# Patient Record
Sex: Male | Born: 1985 | Race: Black or African American | Hispanic: No | Marital: Married | Smoking: Never smoker
Health system: Southern US, Community
[De-identification: ages and names within clinical notes are randomized; demographics above are authoritative.]

## PROBLEM LIST (undated history)

## (undated) DIAGNOSIS — J45909 Unspecified asthma, uncomplicated: Secondary | ICD-10-CM

## (undated) HISTORY — PX: WISDOM TOOTH EXTRACTION: SHX21

---

## 2017-11-25 ENCOUNTER — Other Ambulatory Visit
Admission: RE | Admit: 2017-11-25 | Discharge: 2017-11-25 | Disposition: A | Discharge status: Home / Self Care | Payer: BLUE CROSS/BLUE SHIELD | Source: Ambulatory Visit | Attending: Sports Medicine | Admitting: Sports Medicine

## 2017-11-25 DIAGNOSIS — M25462 Effusion, left knee: Secondary | ICD-10-CM | POA: Insufficient documentation

## 2017-11-25 LAB — SYNOVIAL CELL COUNT + DIFF, W/ CRYSTALS
Crystals, Fluid: NONE SEEN
Eosinophils-Synovial: 0 %
LYMPHOCYTES-SYNOVIAL FLD: 68 %
Monocyte-Macrophage-Synovial Fluid: 15 %
Neutrophil, Synovial: 17 %
OTHER CELLS-SYN: 0
WBC, Synovial: 323 /mm3 — ABNORMAL HIGH (ref 0–200)

## 2017-11-27 ENCOUNTER — Other Ambulatory Visit: Payer: Self-pay | Admitting: Sports Medicine

## 2017-11-27 DIAGNOSIS — M25462 Effusion, left knee: Secondary | ICD-10-CM

## 2017-11-29 LAB — BODY FLUID CULTURE: Culture: NO GROWTH

## 2017-12-03 ENCOUNTER — Ambulatory Visit: Payer: BLUE CROSS/BLUE SHIELD

## 2017-12-23 ENCOUNTER — Ambulatory Visit
Admission: RE | Admit: 2017-12-23 | Discharge: 2017-12-23 | Disposition: A | Discharge status: Home / Self Care | Payer: BLUE CROSS/BLUE SHIELD | Source: Ambulatory Visit | Attending: Sports Medicine | Admitting: Sports Medicine

## 2017-12-23 DIAGNOSIS — M25462 Effusion, left knee: Secondary | ICD-10-CM | POA: Diagnosis present

## 2017-12-30 ENCOUNTER — Other Ambulatory Visit: Payer: Self-pay | Admitting: Orthopedic Surgery

## 2017-12-30 ENCOUNTER — Ambulatory Visit
Admission: RE | Admit: 2017-12-30 | Discharge: 2017-12-30 | Disposition: A | Discharge status: Home / Self Care | Payer: BLUE CROSS/BLUE SHIELD | Source: Ambulatory Visit | Attending: Orthopedic Surgery | Admitting: Orthopedic Surgery

## 2017-12-30 DIAGNOSIS — M2410 Other articular cartilage disorders, unspecified site: Secondary | ICD-10-CM

## 2017-12-30 DIAGNOSIS — M25562 Pain in left knee: Secondary | ICD-10-CM | POA: Diagnosis present

## 2017-12-30 DIAGNOSIS — M2170 Unequal limb length (acquired), unspecified site: Secondary | ICD-10-CM | POA: Insufficient documentation

## 2018-09-16 ENCOUNTER — Other Ambulatory Visit: Payer: Self-pay

## 2018-09-16 ENCOUNTER — Encounter: Payer: Self-pay | Admitting: *Deleted

## 2018-09-23 ENCOUNTER — Ambulatory Visit: Payer: BLUE CROSS/BLUE SHIELD | Admitting: Anesthesiology

## 2018-09-23 ENCOUNTER — Encounter
Admission: RE | Disposition: A | Discharge status: Home / Self Care | Payer: Self-pay | Source: Ambulatory Visit | Attending: Orthopedic Surgery

## 2018-09-23 ENCOUNTER — Ambulatory Visit
Admission: RE | Admit: 2018-09-23 | Discharge: 2018-09-23 | Disposition: A | Discharge status: Home / Self Care | Payer: BLUE CROSS/BLUE SHIELD | Source: Ambulatory Visit | Attending: Orthopedic Surgery | Admitting: Orthopedic Surgery

## 2018-09-23 DIAGNOSIS — Z6830 Body mass index (BMI) 30.0-30.9, adult: Secondary | ICD-10-CM | POA: Diagnosis not present

## 2018-09-23 DIAGNOSIS — M2410 Other articular cartilage disorders, unspecified site: Secondary | ICD-10-CM | POA: Diagnosis not present

## 2018-09-23 DIAGNOSIS — E669 Obesity, unspecified: Secondary | ICD-10-CM | POA: Insufficient documentation

## 2018-09-23 DIAGNOSIS — M65862 Other synovitis and tenosynovitis, left lower leg: Secondary | ICD-10-CM | POA: Insufficient documentation

## 2018-09-23 HISTORY — DX: Unspecified asthma, uncomplicated: J45.909

## 2018-09-23 HISTORY — PX: KNEE ARTHROSCOPY: SHX127

## 2018-09-23 SURGERY — ARTHROSCOPY, KNEE
Anesthesia: General | Site: Knee | Laterality: Left

## 2018-09-23 MED ORDER — ACETAMINOPHEN 500 MG PO TABS: 1000.0000 mg | ORAL_TABLET | Freq: Three times a day (TID) | ORAL | 2 refills | Status: DC

## 2018-09-23 MED ORDER — LACTATED RINGERS IV SOLN
INTRAVENOUS | Status: DC
  Administered 2018-09-23: 13:00:00 via INTRAVENOUS

## 2018-09-23 MED ORDER — FENTANYL CITRATE (PF) 100 MCG/2ML IJ SOLN: 25.0000 ug | INTRAMUSCULAR | Status: DC | PRN

## 2018-09-23 MED ORDER — LIDOCAINE-EPINEPHRINE 1 %-1:100000 IJ SOLN
INTRAMUSCULAR | Status: DC | PRN
  Administered 2018-09-23: 12 mL

## 2018-09-23 MED ORDER — ONDANSETRON 4 MG PO TBDP: 4.0000 mg | ORAL_TABLET | Freq: Three times a day (TID) | ORAL | 0 refills | Status: DC | PRN

## 2018-09-23 MED ORDER — ONDANSETRON HCL 4 MG/2ML IJ SOLN
INTRAMUSCULAR | Status: DC | PRN
  Administered 2018-09-23: 4 mg via INTRAVENOUS

## 2018-09-23 MED ORDER — OXYCODONE HCL 5 MG PO TABS
5.0000 mg | ORAL_TABLET | Freq: Once | ORAL | Status: AC | PRN
  Administered 2018-09-23: 5 mg via ORAL

## 2018-09-23 MED ORDER — HYDROCODONE-ACETAMINOPHEN 5-325 MG PO TABS: 1.0000 | ORAL_TABLET | ORAL | 0 refills | Status: DC | PRN

## 2018-09-23 MED ORDER — DEXAMETHASONE SODIUM PHOSPHATE 4 MG/ML IJ SOLN
INTRAMUSCULAR | Status: DC | PRN
  Administered 2018-09-23: 4 mg via INTRAVENOUS

## 2018-09-23 MED ORDER — OXYCODONE HCL 5 MG/5ML PO SOLN: 5.0000 mg | Freq: Once | ORAL | Status: AC | PRN

## 2018-09-23 MED ORDER — PROPOFOL 10 MG/ML IV BOLUS
INTRAVENOUS | Status: DC | PRN
  Administered 2018-09-23: 200 mg via INTRAVENOUS

## 2018-09-23 MED ORDER — MIDAZOLAM HCL 5 MG/5ML IJ SOLN
INTRAMUSCULAR | Status: DC | PRN
  Administered 2018-09-23: 2 mg via INTRAVENOUS

## 2018-09-23 MED ORDER — GLYCOPYRROLATE 0.2 MG/ML IJ SOLN
INTRAMUSCULAR | Status: DC | PRN
  Administered 2018-09-23: 0.1 mg via INTRAVENOUS

## 2018-09-23 MED ORDER — ASPIRIN EC 325 MG PO TBEC: 325.0000 mg | DELAYED_RELEASE_TABLET | Freq: Every day | ORAL | 0 refills | Status: AC

## 2018-09-23 MED ORDER — LIDOCAINE HCL (CARDIAC) PF 100 MG/5ML IV SOSY
PREFILLED_SYRINGE | INTRAVENOUS | Status: DC | PRN
  Administered 2018-09-23: 30 mg via INTRATRACHEAL

## 2018-09-23 MED ORDER — FENTANYL CITRATE (PF) 100 MCG/2ML IJ SOLN
INTRAMUSCULAR | Status: DC | PRN
  Administered 2018-09-23 (×2): 25 ug via INTRAVENOUS

## 2018-09-23 MED ORDER — CEFAZOLIN SODIUM-DEXTROSE 2-4 GM/100ML-% IV SOLN
2.0000 g | Freq: Once | INTRAVENOUS | Status: AC
  Administered 2018-09-23: 2 g via INTRAVENOUS

## 2018-09-23 MED ORDER — IBUPROFEN 800 MG PO TABS: 800.0000 mg | ORAL_TABLET | Freq: Three times a day (TID) | ORAL | 0 refills | Status: AC

## 2018-09-23 MED ORDER — PROMETHAZINE HCL 25 MG/ML IJ SOLN: 6.2500 mg | INTRAMUSCULAR | Status: DC | PRN

## 2018-09-23 SURGICAL SUPPLY — 39 items
ADAPTER IRRIG TUBE 2 SPIKE SOL (ADAPTER) ×6 IMPLANT
BLADE SURG SZ11 CARB STEEL (BLADE) ×3 IMPLANT
BNDG COHESIVE 4X5 TAN STRL (GAUZE/BANDAGES/DRESSINGS) ×3 IMPLANT
BNDG ESMARK 6X12 TAN STRL LF (GAUZE/BANDAGES/DRESSINGS) ×3 IMPLANT
BUR RADIUS 4.0X18.5 (BURR) ×3 IMPLANT
CHLORAPREP W/TINT 26ML (MISCELLANEOUS) ×3 IMPLANT
COOLER POLAR GLACIER W/PUMP (MISCELLANEOUS) ×3 IMPLANT
COVER LIGHT HANDLE UNIVERSAL (MISCELLANEOUS) ×6 IMPLANT
CUFF TOURN SGL QUICK 30 (MISCELLANEOUS) ×2
CUFF TRNQT CYL LO 30X4X (MISCELLANEOUS) ×1 IMPLANT
DECANTER SPIKE VIAL GLASS SM (MISCELLANEOUS) ×3 IMPLANT
DRAPE IMP U-DRAPE 54X76 (DRAPES) ×3 IMPLANT
GAUZE SPONGE 4X4 12PLY STRL (GAUZE/BANDAGES/DRESSINGS) ×3 IMPLANT
GLOVE BIO SURGEON STRL SZ7.5 (GLOVE) ×3 IMPLANT
GLOVE BIOGEL PI IND STRL 8 (GLOVE) ×1 IMPLANT
GLOVE BIOGEL PI INDICATOR 8 (GLOVE) ×2
GOWN STRL REIN 2XL XLG LVL4 (GOWN DISPOSABLE) ×6 IMPLANT
GOWN STRL REUS W/TWL LRG LVL3 (GOWN DISPOSABLE) ×3 IMPLANT
IV LACTATED RINGER IRRG 3000ML (IV SOLUTION) ×8
IV LR IRRIG 3000ML ARTHROMATIC (IV SOLUTION) ×4 IMPLANT
KIT TURNOVER KIT A (KITS) ×3 IMPLANT
MAT ABSORB  FLUID 56X50 GRAY (MISCELLANEOUS) ×4
MAT ABSORB FLUID 56X50 GRAY (MISCELLANEOUS) ×2 IMPLANT
NEEDLE HYPO 21X1.5 SAFETY (NEEDLE) ×3 IMPLANT
NEPTUNE MANIFOLD (MISCELLANEOUS) ×3 IMPLANT
PACK ARTHROSCOPY KNEE (MISCELLANEOUS) ×3 IMPLANT
PAD ABD DERMACEA PRESS 5X9 (GAUZE/BANDAGES/DRESSINGS) ×6 IMPLANT
PAD WRAPON POLAR KNEE (MISCELLANEOUS) ×1 IMPLANT
PADDING CAST BLEND 6X4 STRL (MISCELLANEOUS) ×1 IMPLANT
PADDING STRL CAST 6IN (MISCELLANEOUS) ×2
SET TUBE SUCT SHAVER OUTFL 24K (TUBING) ×3 IMPLANT
SET TUBE TIP INTRA-ARTICULAR (MISCELLANEOUS) ×3 IMPLANT
SUT ETHILON 3-0 FS-10 30 BLK (SUTURE) ×6
SUTURE EHLN 3-0 FS-10 30 BLK (SUTURE) ×2 IMPLANT
TOWEL OR 17X26 4PK STRL BLUE (TOWEL DISPOSABLE) ×6 IMPLANT
TUBING ARTHRO INFLOW-ONLY STRL (TUBING) ×3 IMPLANT
WAND HAND CNTRL MULTIVAC 50 (MISCELLANEOUS) IMPLANT
WAND HAND CNTRL MULTIVAC 90 (MISCELLANEOUS) ×3 IMPLANT
WRAPON POLAR PAD KNEE (MISCELLANEOUS) ×3

## 2018-09-23 NOTE — Anesthesia Postprocedure Evaluation (Signed)
Anesthesia Post Note  Patient: Benjamin Mcneil  Procedure(s) Performed: ARTHROSCOPY KNEE CHONDROPLASTY AND CARTILAGE BIOPSY FOR MACI SURGERY (Left Knee)  Patient location during evaluation: PACU Anesthesia Type: General Level of consciousness: awake and alert Pain management: pain level controlled Vital Signs Assessment: post-procedure vital signs reviewed and stable Respiratory status: spontaneous breathing, nonlabored ventilation, respiratory function stable and patient connected to nasal cannula oxygen Cardiovascular status: blood pressure returned to baseline and stable Postop Assessment: no apparent nausea or vomiting Anesthetic complications: no    Rheba Diamond C

## 2018-09-23 NOTE — Anesthesia Preprocedure Evaluation (Signed)
Anesthesia Evaluation  Patient identified by MRN, date of birth, ID band Patient awake    Reviewed: Allergy & Precautions, NPO status , Patient's Chart, lab work & pertinent test results  Airway Mallampati: II  TM Distance: >3 FB Neck ROM: Full    Dental no notable dental hx.    Pulmonary asthma ,  No current asthma symptoms.    Pulmonary exam normal breath sounds clear to auscultation       Cardiovascular negative cardio ROS Normal cardiovascular exam Rhythm:Regular Rate:Normal     Neuro/Psych negative neurological ROS  negative psych ROS   GI/Hepatic negative GI ROS, Neg liver ROS,   Endo/Other  negative endocrine ROS  Renal/GU negative Renal ROS  negative genitourinary   Musculoskeletal negative musculoskeletal ROS (+)   Abdominal   Peds negative pediatric ROS (+)  Hematology negative hematology ROS (+)   Anesthesia Other Findings   Reproductive/Obstetrics negative OB ROS                             Anesthesia Physical Anesthesia Plan  ASA: I  Anesthesia Plan: General   Post-op Pain Management:    Induction: Intravenous  PONV Risk Score and Plan:   Airway Management Planned: LMA  Additional Equipment:   Intra-op Plan:   Post-operative Plan: Extubation in OR  Informed Consent: I have reviewed the patients History and Physical, chart, labs and discussed the procedure including the risks, benefits and alternatives for the proposed anesthesia with the patient or authorized representative who has indicated his/her understanding and acceptance.   Dental advisory given  Plan Discussed with: CRNA  Anesthesia Plan Comments:         Anesthesia Quick Evaluation

## 2018-09-23 NOTE — OR Nursing (Signed)
3 cartilage specimens placed in provided medium and shipped in provided shipping container to [employer].

## 2018-09-23 NOTE — Op Note (Signed)
DATE: 09/23/2018   PRE-OP DIAGNOSIS:  1. Left medial femoral condyle chondral defect   POST-OP DIAGNOSIS:  1. Left medial femoral condyle chondral defect  2. Left trochlea chondral defect   PROCEDURES:  1. Left medial femoral condyle, trochlea chondroplasty 2. Left knee autologous chondrocyte biopsy  SURGEON:  Novella Olive, MD  ASSISTANT(S):  none  ANESTHESIA: Gen w/LMA  TOTAL IV FLUIDS: See anesthesia record  ESTIMATED BLOOD LOSS: Minimal  TOURNIQUET TIME:  36 min.  DRAINS:  None.  SPECIMENS: Autologous chondrocyte biopsy sent to [employer].  IMPLANTS: None.  COMPLICATIONS: none  INDICATIONS: Benjamin Mcneil is a 32 y.o. male who initially presented in January 2019 for left knee pain.   Symptoms have been present for almost two years without known traumatic event.  Clinical and radiographic studies were notable for a left knee effusion and full thickness chondral lesions of the medial femoral condyle. He underwent a course of conservative management in the form of activity modifications, medications, corticosteroid injection, and physical therapy, with only partial relief of symptoms. He is unable to be as active as he would like due to his knee pain. We suggested proceeding with the autologous chondrocyte implantation procedure given the size of the lesion (>2 cm squared) and bone depth (<8 mm). Today's procedure is for autologous chondrocyte biopsy, lesion sizing, and chondroplasty. This is a staged procedure with this being the first stage. The second stage will consist of autologous chondrocyte implantation in ~6-8 weeks.   DESCRIPTION OF PROCEDURE: The patient was seen in the Holding Room. The risks, benefits, complications, treatment options, and expected outcomes were discussed with the patient. The patient concurred with the proposed plan, giving informed consent.  The site of surgery was properly noted/marked.   The patient was taken to Operating Room,  identified as Benjamin Mcneil and the procedure verified. A Time Out was held and the above information confirmed. After administration of adequate anesthesia, the entire lower extremity was prescrubbed with Hibiclens and alcohol, prepped with Chloroprep, and draped in sterile fashion. The patient was given pre-operative IV antibiotics within 30 minutes of the skin incision.   Arthroscopy portals were marked and injected with a solution of dilute epinephrine in 1% lidocaine and 3 portals were established with an 11 blade including a superolateral portal to provide for out flow of the joint.     First, a knee arthroscopy was performed.  The arthroscope was placed in the anterolateral portal. Hoffa's fat pad was inflamed and impinging in the patellofemoral joint. The medial and lateral gutters were normal. Synovitis was shaved and ablated about Hoffa's fat pad using the ArthroCare device.   Patella tracking was normal.The patella articular cartilage was mostly normal with small areas of Grade 1 softening over the median ridge. The trochlea articular cartilage had a grade 4, 1.0 x 1.5 cm chondral defect of the central portion.  An oscillating shaver was used to dbride the areas of pathology to leave stable borders.  Medial compartment: Articular cartilage of the medial femoral condyle was notable for a grade 4, 2.1 x 1.5 cm chondral defect of the medial femoral condyle. Articular cartilage of the medial tibial plateau was normal.The medial meniscus was normal. An oscillating shaver was used to dbride the areas of pathology to leave stable borders.  Lateral compartment: Articular cartilage of the lateral femoral condyle was normal. Articular cartilage of the lateral tibial plateau was notable for areas of Grade 1 softening.The lateral meniscus was normal. Biting forceps and an oscillating  shaver were used to dbride the areas of pathology to leave stable borders.  A ring curette was used to harvest several  3-4 mm pieces of articular cartilage from the lateral aspect of the intercondylar notch for autologous chondrocyte biopsy.  The knee was then copiously irrigated and excess fluid was expressed from the joint. Closure of the portals with 3-0 nylon was performed. The knee was injected with local anesthetic. Xeroform gauze and dry sterile dressings were applied.  Instrument, sponge, and needle counts were correct prior to wound closure and at the conclusion of the case.   POSTOPERATIVE PLAN: The patient will be discharged home today.    Weightbearing as tolerated.  Crutches as needed for pain and to avoid limping.  Narcotic medication, NSAID, and acetaminophen as discussed pre-operatively. ASA for DVT ppx. Patient to return to clinic 10-14 days postop for suture removal.

## 2018-09-23 NOTE — Transfer of Care (Signed)
Immediate Anesthesia Transfer of Care Note  Patient: Benjamin Mcneil  Procedure(s) Performed: ARTHROSCOPY KNEE CHONDROPLASTY AND CARTILAGE BIOPSY FOR MACI SURGERY (Left Knee)  Patient Location: PACU  Anesthesia Type: General  Level of Consciousness: awake, alert  and patient cooperative  Airway and Oxygen Therapy: Patient Spontanous Breathing and Patient connected to supplemental oxygen  Post-op Assessment: Post-op Vital signs reviewed, Patient's Cardiovascular Status Stable, Respiratory Function Stable, Patent Airway and No signs of Nausea or vomiting  Post-op Vital Signs: Reviewed and stable  Complications: No apparent anesthesia complications

## 2018-09-23 NOTE — Anesthesia Procedure Notes (Signed)
Procedure Name: LMA Insertion Date/Time: 09/23/2018 1:25 PM Performed by: Maree Krabbe, CRNA Pre-anesthesia Checklist: Patient identified, Emergency Drugs available, Suction available, Timeout performed and Patient being monitored Patient Re-evaluated:Patient Re-evaluated prior to induction Oxygen Delivery Method: Circle system utilized Preoxygenation: Pre-oxygenation with 100% oxygen Induction Type: IV induction LMA: LMA inserted LMA Size: 5.0 Number of attempts: 1 Placement Confirmation: positive ETCO2 and breath sounds checked- equal and bilateral Tube secured with: Tape Dental Injury: Teeth and Oropharynx as per pre-operative assessment

## 2018-09-23 NOTE — Discharge Instructions (Signed)
Arthroscopic Knee Surgery   Post-Op Instructions   1. Bracing or crutches: Crutches will be provided at the time of discharge from the surgery center if you do not already have them.   2. Ice: You may be provided with a device [facility]) that allows you to ice the affected area effectively. Otherwise you can ice manually.    3. Driving:  Plan on not driving for at least one week. Please note that you are advised NOT to drive while taking narcotic pain medications as you may be impaired and unsafe to drive.   4. Activity: Ankle pumps several times an hour while awake to prevent blood clots. Weight bearing: as tolerated. Use crutches for as needed (usually ~1 week or less) until pain allows you to ambulate without a limp. Bending and straightening the knee is unlimited. Elevate knee above heart level as much as possible for one week. Avoid standing more than 5 minutes (consecutively) for the first week.  Avoid long distance travel for 2 weeks.   5. Medications:  - You have been provided a prescription for narcotic pain medicine. After surgery, take 1-2 narcotic tablets every 4 hours if needed for severe pain.  - You may take up to 3000mg /day of tylenol (acetaminophen). You can take 1000mg  3x/day. Please check your narcotic. If you have acetaminophen in your narcotic (each tablet will be 325mg ), be careful not to exceed a total of 3000mg /day of acetaminophen.  - A prescription for anti-nausea medication will be provided in case the narcotic medicine causes nausea - take 1 tablet every 6 hours only if nauseated.  - Take ibuprofen 800 mg every 8 hours WITH food to reduce post-operative knee swelling. DO NOT STOP IBUPROFEN POST-OP UNTIL INSTRUCTED TO DO SO at first post-op office visit (10-14 days after surgery). However, please discontinue if you have any abdominal discomfort after taking this.  - Take enteric coated aspirin 325 mg once daily for 2 weeks to prevent blood clots.   6. Bandages: The  physical therapist should change the bandages at the first post-op appointment. If needed, the dressing supplies have been provided to you.   7. Physical Therapy: 1-2 times per week for 6 weeks. Therapy typically starts on post operative Day 3 or 4. You have been provided an order for physical therapy. The therapist will provide home exercises.   8. Work: May return to full work usually around 2 weeks after 1st post-operative visit. May do light duty/desk job in approximately 1-2 weeks when off of narcotics, pain is well-controlled, and swelling has decreased. Labor intensive jobs may require 4-6 weeks to return.    9. Post-Op Appointments: Your first post-op appointment will be with Dr. Allena Katz in approximately 2 weeks time.    If you find that they have not been scheduled please call the Orthopaedic Appointment front desk at [phone].      General Anesthesia, Adult, Care After These instructions provide you with information about caring for yourself after your procedure. Your health care provider may also give you more specific instructions. Your treatment has been planned according to current medical practices, but problems sometimes occur. Call your health care provider if you have any problems or questions after your procedure. What can I expect after the procedure? After the procedure, it is common to have:  Vomiting.  A sore throat.  Mental slowness.  It is common to feel:  Nauseous.  Cold or shivery.  Sleepy.  Tired.  Sore or achy, even in parts  of your body where you did not have surgery.  Follow these instructions at home: For at least 24 hours after the procedure:  Do not: ? Participate in activities where you could fall or become injured. ? Drive. ? Use heavy machinery. ? Drink alcohol. ? Take sleeping pills or medicines that cause drowsiness. ? Make important decisions or sign legal documents. ? Take care of children on your own.  Rest. Eating and  drinking  If you vomit, drink water, juice, or soup when you can drink without vomiting.  Drink enough fluid to keep your urine clear or pale yellow.  Make sure you have little or no nausea before eating solid foods.  Follow the diet recommended by your health care provider. General instructions  Have a responsible adult stay with you until you are awake and alert.  Return to your normal activities as told by your health care provider. Ask your health care provider what activities are safe for you.  Take over-the-counter and prescription medicines only as told by your health care provider.  If you smoke, do not smoke without supervision.  Keep all follow-up visits as told by your health care provider. This is important. Contact a health care provider if:  You continue to have nausea or vomiting at home, and medicines are not helpful.  You cannot drink fluids or start eating again.  You cannot urinate after 8-12 hours.  You develop a skin rash.  You have fever.  You have increasing redness at the site of your procedure. Get help right away if:  You have difficulty breathing.  You have chest pain.  You have unexpected bleeding.  You feel that you are having a life-threatening or urgent problem. This information is not intended to replace advice given to you by your health care provider. Make sure you discuss any questions you have with your health care provider. Document Released: 03/23/2001 Document Revised: 05/19/2016 Document Reviewed: 11/29/2015 Elsevier Interactive Patient Education  [employer].

## 2018-09-23 NOTE — H&P (Signed)
Paper H&P to be scanned into permanent record. H&P reviewed. No significant changes noted.  

## 2018-09-24 ENCOUNTER — Encounter: Payer: Self-pay | Admitting: Orthopedic Surgery

## 2018-11-01 ENCOUNTER — Encounter
Admission: RE | Admit: 2018-11-01 | Discharge: 2018-11-01 | Disposition: A | Discharge status: Home / Self Care | Payer: BLUE CROSS/BLUE SHIELD | Source: Ambulatory Visit | Attending: Orthopedic Surgery | Admitting: Orthopedic Surgery

## 2018-11-01 ENCOUNTER — Other Ambulatory Visit: Payer: Self-pay

## 2018-11-01 NOTE — Patient Instructions (Signed)
Your procedure is scheduled on: 11-08-18  Report to Same Day Surgery 2nd floor medical mall [facility] Entrance-take elevator on left to 2nd floor.  Check in with surgery information desk.) To find out your arrival time please call [phone] between 1PM - 3PM on 11-05-18  Remember: Instructions that are not followed completely may result in serious medical risk, up to and including death, or upon the discretion of your surgeon and anesthesiologist your surgery may need to be rescheduled.    _x___ 1. Do not eat food after midnight the night before your procedure. You may drink clear liquids up to 2 hours before you are scheduled to arrive at the hospital for your procedure.  Do not drink clear liquids within 2 hours of your scheduled arrival to the hospital.  Clear liquids include  --Water or Apple juice without pulp  --Clear carbohydrate beverage such as ClearFast or Gatorade  --Black Coffee or Clear Tea (No milk, no creamers, do not add anything to the coffee or Tea   ____Ensure clear carbohydrate drink on the way to the hospital for bariatric patients  ____Ensure clear carbohydrate drink 3 hours before surgery for Dr Rutherford Nail patients if physician instructed.   No gum chewing or hard candies.     __x__ 2. No Alcohol for 24 hours before or after surgery.   __x__3. No Smoking or e-cigarettes for 24 prior to surgery.  Do not use any chewable tobacco products for at least 6 hour prior to surgery   ____  4. Bring all medications with you on the day of surgery if instructed.    __x__ 5. Notify your doctor if there is any change in your medical condition     (cold, fever, infections).    x___6. On the morning of surgery brush your teeth with toothpaste and water.  You may rinse your mouth with mouth wash if you wish.  Do not swallow any toothpaste or mouthwash.   Do not wear jewelry, make-up, hairpins, clips or nail polish.  Do not wear lotions, powders, or perfumes. You may wear  deodorant.  Do not shave 48 hours prior to surgery. Men may shave face and neck.  Do not bring valuables to the hospital.    [facility] is not responsible for any belongings or valuables.               Contacts, dentures or bridgework may not be worn into surgery.  Leave your suitcase in the car. After surgery it may be brought to your room.  For patients admitted to the hospital, discharge time is determined by your  treatment team.  _  Patients discharged the day of surgery will not be allowed to drive home.  You will need someone to drive you home and stay with you the night of your procedure.    Please read over the following fact sheets that you were given:   [facility] Preparing for Surgery   ____ Take anti-hypertensive listed below, cardiac, seizure, asthma,anti-reflux and psychiatric medicines. These include:  1. NONE  2.  3.  4.  5.  6.  ____Fleets enema or Magnesium Citrate as directed.   ____ Use CHG Soap or sage wipes as directed on instruction sheet   _X___ Use inhalers on the day of surgery and bring to hospital day of surgery-USE YOUR ALBUTEROL INHALER AT HOME AND BRING TO HOSPITAL  ____ Stop Metformin and Janumet 2 days prior to surgery.    ____ Take 1/2 of  usual insulin dose the night before surgery and none on the morning surgery.   ____ Follow recommendations from Cardiologist, Pulmonologist or PCP regarding  stopping Aspirin, Coumadin, Plavix ,Eliquis, Effient, or Pradaxa, and Pletal.  X____Stop Anti-inflammatories such as Advil, Aleve, Ibuprofen, Motrin, Naproxen, Naprosyn, Goodies powders or aspirin products NOW-OK to take Tylenol    ____ Stop supplements until after surgery.    ____ Bring C-Pap to the hospital.

## 2018-11-07 MED ORDER — CEFAZOLIN SODIUM-DEXTROSE 2-4 GM/100ML-% IV SOLN
2.0000 g | Freq: Once | INTRAVENOUS | Status: AC
  Administered 2018-11-08: 2 g via INTRAVENOUS

## 2018-11-08 ENCOUNTER — Encounter: Payer: Self-pay | Admitting: *Deleted

## 2018-11-08 ENCOUNTER — Observation Stay: Payer: BLUE CROSS/BLUE SHIELD

## 2018-11-08 ENCOUNTER — Ambulatory Visit: Payer: BLUE CROSS/BLUE SHIELD | Admitting: Anesthesiology

## 2018-11-08 ENCOUNTER — Encounter
Admission: RE | Disposition: A | Discharge status: Home / Self Care | Payer: Self-pay | Source: Ambulatory Visit | Attending: Orthopedic Surgery

## 2018-11-08 ENCOUNTER — Observation Stay
Admission: RE | Admit: 2018-11-08 | Discharge: 2018-11-09 | Disposition: A | Discharge status: Home / Self Care | Payer: BLUE CROSS/BLUE SHIELD | Source: Ambulatory Visit | Attending: Orthopedic Surgery | Admitting: Orthopedic Surgery

## 2018-11-08 ENCOUNTER — Other Ambulatory Visit: Payer: Self-pay

## 2018-11-08 DIAGNOSIS — M238X2 Other internal derangements of left knee: Secondary | ICD-10-CM | POA: Diagnosis present

## 2018-11-08 DIAGNOSIS — Z419 Encounter for procedure for purposes other than remedying health state, unspecified: Secondary | ICD-10-CM

## 2018-11-08 DIAGNOSIS — M948X6 Other specified disorders of cartilage, lower leg: Secondary | ICD-10-CM | POA: Diagnosis not present

## 2018-11-08 DIAGNOSIS — M228X2 Other disorders of patella, left knee: Secondary | ICD-10-CM | POA: Diagnosis not present

## 2018-11-08 HISTORY — PX: OSTEOCHONDRAL DEFECT REPAIR/RECONSTRUCTION: SHX6232

## 2018-11-08 SURGERY — APPLICATION, GRAFT, OSTEOCHONDRAL, KNEE
Anesthesia: General | Laterality: Left

## 2018-11-08 MED ORDER — GELATIN ABSORBABLE 12-7 MM EX MISC
CUTANEOUS | Status: AC
  Filled 2018-11-08: qty 1

## 2018-11-08 MED ORDER — METHOCARBAMOL 500 MG PO TABS: 500.0000 mg | ORAL_TABLET | Freq: Four times a day (QID) | ORAL | Status: DC | PRN

## 2018-11-08 MED ORDER — ASPIRIN EC 325 MG PO TBEC
325.0000 mg | DELAYED_RELEASE_TABLET | Freq: Every day | ORAL | Status: DC
  Administered 2018-11-09: 325 mg via ORAL
  Filled 2018-11-08: qty 1

## 2018-11-08 MED ORDER — OXYCODONE HCL 5 MG PO TABS
5.0000 mg | ORAL_TABLET | ORAL | Status: DC | PRN
  Administered 2018-11-08 – 2018-11-09 (×3): 5 mg via ORAL
  Filled 2018-11-08 (×3): qty 1

## 2018-11-08 MED ORDER — ONDANSETRON HCL 4 MG/2ML IJ SOLN
INTRAMUSCULAR | Status: DC | PRN
  Administered 2018-11-08: 4 mg via INTRAVENOUS

## 2018-11-08 MED ORDER — MIDAZOLAM HCL 2 MG/2ML IJ SOLN: 1.0000 mg | Freq: Once | INTRAMUSCULAR | Status: DC

## 2018-11-08 MED ORDER — OXYCODONE HCL 5 MG PO TABS: 5.0000 mg | ORAL_TABLET | Freq: Once | ORAL | Status: DC | PRN

## 2018-11-08 MED ORDER — METHOCARBAMOL 1000 MG/10ML IJ SOLN
500.0000 mg | Freq: Four times a day (QID) | INTRAVENOUS | Status: DC | PRN
  Filled 2018-11-08: qty 5

## 2018-11-08 MED ORDER — SENNOSIDES-DOCUSATE SODIUM 8.6-50 MG PO TABS: 1.0000 | ORAL_TABLET | Freq: Every evening | ORAL | Status: DC | PRN

## 2018-11-08 MED ORDER — FAMOTIDINE 20 MG PO TABS
20.0000 mg | ORAL_TABLET | Freq: Once | ORAL | Status: AC
  Administered 2018-11-08: 20 mg via ORAL

## 2018-11-08 MED ORDER — BUPIVACAINE LIPOSOME 1.3 % IJ SUSP
INTRAMUSCULAR | Status: DC | PRN
  Administered 2018-11-08: 15 mL

## 2018-11-08 MED ORDER — ALBUTEROL SULFATE HFA 108 (90 BASE) MCG/ACT IN AERS: 2.0000 | INHALATION_SPRAY | Freq: Four times a day (QID) | RESPIRATORY_TRACT | Status: DC | PRN

## 2018-11-08 MED ORDER — SODIUM CHLORIDE 0.9 % IV SOLN
INTRAVENOUS | Status: DC
  Administered 2018-11-08 – 2018-11-09 (×2): via INTRAVENOUS

## 2018-11-08 MED ORDER — FENTANYL CITRATE (PF) 100 MCG/2ML IJ SOLN
INTRAMUSCULAR | Status: AC
  Administered 2018-11-08: 25 ug via INTRAVENOUS
  Filled 2018-11-08: qty 2

## 2018-11-08 MED ORDER — DOCUSATE SODIUM 100 MG PO CAPS
100.0000 mg | ORAL_CAPSULE | Freq: Two times a day (BID) | ORAL | Status: DC
  Administered 2018-11-08 – 2018-11-09 (×3): 100 mg via ORAL
  Filled 2018-11-08 (×3): qty 1

## 2018-11-08 MED ORDER — THROMBIN 5000 UNITS EX SOLR
CUTANEOUS | Status: AC
  Filled 2018-11-08: qty 5000

## 2018-11-08 MED ORDER — SUGAMMADEX SODIUM 200 MG/2ML IV SOLN
INTRAVENOUS | Status: DC | PRN
  Administered 2018-11-08: 200 mg via INTRAVENOUS

## 2018-11-08 MED ORDER — BUPIVACAINE-EPINEPHRINE 0.5% -1:200000 IJ SOLN
INTRAMUSCULAR | Status: DC | PRN
  Administered 2018-11-08: 25 mL

## 2018-11-08 MED ORDER — LIDOCAINE HCL (PF) 1 % IJ SOLN
INTRAMUSCULAR | Status: DC | PRN
  Administered 2018-11-08: 3 mL

## 2018-11-08 MED ORDER — BUPIVACAINE-EPINEPHRINE (PF) 0.5% -1:200000 IJ SOLN
INTRAMUSCULAR | Status: AC
  Filled 2018-11-08: qty 30

## 2018-11-08 MED ORDER — DIPHENHYDRAMINE HCL 12.5 MG/5ML PO ELIX: 12.5000 mg | ORAL_SOLUTION | ORAL | Status: DC | PRN

## 2018-11-08 MED ORDER — GELATIN ABSORBABLE 100 CM EX MISC
CUTANEOUS | Status: AC
  Filled 2018-11-08: qty 1

## 2018-11-08 MED ORDER — PROMETHAZINE HCL 25 MG/ML IJ SOLN: 6.2500 mg | INTRAMUSCULAR | Status: DC | PRN

## 2018-11-08 MED ORDER — MIDAZOLAM HCL 2 MG/2ML IJ SOLN
INTRAMUSCULAR | Status: AC
  Filled 2018-11-08: qty 2

## 2018-11-08 MED ORDER — ACETAMINOPHEN 10 MG/ML IV SOLN
INTRAVENOUS | Status: DC | PRN
  Administered 2018-11-08: 1000 mg via INTRAVENOUS

## 2018-11-08 MED ORDER — HYDROMORPHONE HCL 1 MG/ML IJ SOLN: 0.5000 mg | INTRAMUSCULAR | Status: DC | PRN

## 2018-11-08 MED ORDER — PROPOFOL 10 MG/ML IV BOLUS
INTRAVENOUS | Status: DC | PRN
  Administered 2018-11-08: 180 mg via INTRAVENOUS
  Administered 2018-11-08: 20 mg via INTRAVENOUS

## 2018-11-08 MED ORDER — ONDANSETRON HCL 4 MG/2ML IJ SOLN: 4.0000 mg | Freq: Four times a day (QID) | INTRAMUSCULAR | Status: DC | PRN

## 2018-11-08 MED ORDER — GELATIN ABSORBABLE 12-7 MM EX MISC
CUTANEOUS | Status: DC | PRN
  Administered 2018-11-08 (×2): 1

## 2018-11-08 MED ORDER — FENTANYL CITRATE (PF) 100 MCG/2ML IJ SOLN
INTRAMUSCULAR | Status: DC | PRN
  Administered 2018-11-08: 100 ug via INTRAVENOUS
  Administered 2018-11-08 (×2): 50 ug via INTRAVENOUS

## 2018-11-08 MED ORDER — LIDOCAINE HCL (CARDIAC) PF 100 MG/5ML IV SOSY
PREFILLED_SYRINGE | INTRAVENOUS | Status: DC | PRN
  Administered 2018-11-08: 40 mg via INTRAVENOUS
  Administered 2018-11-08: 10 mg via INTRAVENOUS

## 2018-11-08 MED ORDER — ROCURONIUM BROMIDE 100 MG/10ML IV SOLN
INTRAVENOUS | Status: DC | PRN
  Administered 2018-11-08: 10 mg via INTRAVENOUS
  Administered 2018-11-08: 40 mg via INTRAVENOUS
  Administered 2018-11-08: 10 mg via INTRAVENOUS

## 2018-11-08 MED ORDER — DEXAMETHASONE SODIUM PHOSPHATE 10 MG/ML IJ SOLN
INTRAMUSCULAR | Status: DC | PRN
  Administered 2018-11-08: 10 mg via INTRAVENOUS

## 2018-11-08 MED ORDER — ALBUTEROL SULFATE (2.5 MG/3ML) 0.083% IN NEBU: 2.5000 mg | INHALATION_SOLUTION | Freq: Four times a day (QID) | RESPIRATORY_TRACT | Status: DC | PRN

## 2018-11-08 MED ORDER — LIDOCAINE HCL (PF) 1 % IJ SOLN
INTRAMUSCULAR | Status: AC
  Filled 2018-11-08: qty 5

## 2018-11-08 MED ORDER — FENTANYL CITRATE (PF) 100 MCG/2ML IJ SOLN
25.0000 ug | INTRAMUSCULAR | Status: DC | PRN
  Administered 2018-11-08 (×5): 25 ug via INTRAVENOUS

## 2018-11-08 MED ORDER — ROPIVACAINE HCL 5 MG/ML IJ SOLN
INTRAMUSCULAR | Status: AC
  Filled 2018-11-08: qty 30

## 2018-11-08 MED ORDER — ROPIVACAINE HCL 5 MG/ML IJ SOLN
INTRAMUSCULAR | Status: DC | PRN
  Administered 2018-11-08: 30 mL via PERINEURAL

## 2018-11-08 MED ORDER — ONDANSETRON HCL 4 MG PO TABS: 4.0000 mg | ORAL_TABLET | Freq: Four times a day (QID) | ORAL | Status: DC | PRN

## 2018-11-08 MED ORDER — LACTATED RINGERS IV SOLN
INTRAVENOUS | Status: DC
  Administered 2018-11-08 (×2): via INTRAVENOUS

## 2018-11-08 MED ORDER — ACETAMINOPHEN 500 MG PO TABS
1000.0000 mg | ORAL_TABLET | Freq: Three times a day (TID) | ORAL | Status: DC
  Administered 2018-11-08 – 2018-11-09 (×3): 1000 mg via ORAL
  Filled 2018-11-08 (×3): qty 2

## 2018-11-08 MED ORDER — MIDAZOLAM HCL 2 MG/2ML IJ SOLN
INTRAMUSCULAR | Status: DC | PRN
  Administered 2018-11-08: 2 mg via INTRAVENOUS

## 2018-11-08 MED ORDER — THROMBIN 5000 UNITS EX SOLR
CUTANEOUS | Status: DC | PRN
  Administered 2018-11-08: 5000 [IU] via TOPICAL

## 2018-11-08 MED ORDER — OXYCODONE HCL 5 MG/5ML PO SOLN: 5.0000 mg | Freq: Once | ORAL | Status: DC | PRN

## 2018-11-08 MED ORDER — MEPERIDINE HCL 50 MG/ML IJ SOLN: 6.2500 mg | INTRAMUSCULAR | Status: DC | PRN

## 2018-11-08 MED ORDER — CEFAZOLIN SODIUM-DEXTROSE 2-4 GM/100ML-% IV SOLN
2.0000 g | Freq: Four times a day (QID) | INTRAVENOUS | Status: AC
  Administered 2018-11-08 – 2018-11-09 (×3): 2 g via INTRAVENOUS
  Filled 2018-11-08 (×3): qty 100

## 2018-11-08 MED ORDER — BUPIVACAINE LIPOSOME 1.3 % IJ SUSP
INTRAMUSCULAR | Status: AC
  Filled 2018-11-08: qty 20

## 2018-11-08 SURGICAL SUPPLY — 62 items
APPLICATOR COTTON TIP 6 STRL (MISCELLANEOUS) ×2 IMPLANT
APPLICATOR COTTON TIP 6IN STRL (MISCELLANEOUS) ×6
BIT DRILL Q COUPLING 4.5 (BIT) ×3 IMPLANT
BIT DRILL Q/COUPLING 1 (BIT) ×3 IMPLANT
BLADE OSCILLATING/SAGITTAL (BLADE) ×4
BLADE SURG 15 STRL LF DISP TIS (BLADE) ×1 IMPLANT
BLADE SURG 15 STRL SS (BLADE) ×2
BLADE SW THK.38XMED LNG THN (BLADE) ×2 IMPLANT
BNDG ESMARK 6X12 TAN STRL LF (GAUZE/BANDAGES/DRESSINGS) ×3 IMPLANT
CANISTER SUCT 1200ML W/VALVE (MISCELLANEOUS) ×3 IMPLANT
CANISTER SUCT 3000ML PPV (MISCELLANEOUS) ×3 IMPLANT
CHLORAPREP W/TINT 26ML (MISCELLANEOUS) ×3 IMPLANT
COUNTER NEEDLE 20/40 LG (NEEDLE) ×3 IMPLANT
COVER MAYO STAND STRL (DRAPES) ×3 IMPLANT
COVER WAND RF STERILE (DRAPES) IMPLANT
CUFF TOURN 24 STER (MISCELLANEOUS) IMPLANT
CUFF TOURN 30 STER DUAL PORT (MISCELLANEOUS) ×3 IMPLANT
DRAPE C-ARM XRAY 36X54 (DRAPES) ×3 IMPLANT
DRAPE C-ARMOR (DRAPES) ×3 IMPLANT
DRAPE INCISE IOBAN 66X45 STRL (DRAPES) ×6 IMPLANT
DRAPE SHEET LG 3/4 BI-LAMINATE (DRAPES) ×6 IMPLANT
DRSG TEGADERM 2-3/8X2-3/4 SM (GAUZE/BANDAGES/DRESSINGS) ×3 IMPLANT
DRSG TEGADERM 4X4.75 (GAUZE/BANDAGES/DRESSINGS) ×3 IMPLANT
ELECT REM PT RETURN 9FT ADLT (ELECTROSURGICAL) ×3
ELECTRODE REM PT RTRN 9FT ADLT (ELECTROSURGICAL) ×1 IMPLANT
GLOVE BIOGEL PI IND STRL 8 (GLOVE) ×3 IMPLANT
GLOVE BIOGEL PI INDICATOR 8 (GLOVE) ×6
GLOVE SURG SYN 7.5  E (GLOVE) ×12
GLOVE SURG SYN 7.5 E (GLOVE) ×6 IMPLANT
GOWN STRL REUS W/ TWL LRG LVL3 (GOWN DISPOSABLE) ×1 IMPLANT
GOWN STRL REUS W/ TWL XL LVL3 (GOWN DISPOSABLE) ×2 IMPLANT
GOWN STRL REUS W/TWL LRG LVL3 (GOWN DISPOSABLE) ×2
GOWN STRL REUS W/TWL XL LVL3 (GOWN DISPOSABLE) ×4
GRADUATE 1200CC STRL 31836 (MISCELLANEOUS) ×6 IMPLANT
HEMOVAC 400CC 10FR (MISCELLANEOUS) IMPLANT
KIT TURNOVER KIT A (KITS) ×3 IMPLANT
MACI AUTOLOGOUS CELL SCAFFOLD (Tissue) ×3 IMPLANT
NDL KEITH SZ2.5 (NEEDLE) ×6 IMPLANT
NEEDLE HYPO 22GX1.5 SAFETY (NEEDLE) ×3 IMPLANT
NS IRRIG 1000ML POUR BTL (IV SOLUTION) ×3 IMPLANT
PACK TOTAL KNEE (MISCELLANEOUS) ×3 IMPLANT
PATTIES SURGICAL .5 X.5 (GAUZE/BANDAGES/DRESSINGS) IMPLANT
SCAFFOLD CELL AUTOLOGOUS MACI (Tissue) ×1 IMPLANT
SCREW CORT ST 4.5X50 (Screw) ×3 IMPLANT
SCREW CORTEX 4.5X58MM (Screw) ×3 IMPLANT
SPONGE KITTNER 5P (MISCELLANEOUS) ×3 IMPLANT
SUCTION FRAZIER HANDLE 10FR (MISCELLANEOUS) ×2
SUCTION TUBE FRAZIER 10FR DISP (MISCELLANEOUS) ×1 IMPLANT
SUT BONE WAX W31G (SUTURE) ×3 IMPLANT
SUT ETHILON 3-0 FS-10 30 BLK (SUTURE) ×3
SUT MNCRL 4-0 (SUTURE) ×2
SUT MNCRL 4-0 27XMFL (SUTURE) ×1
SUT VIC AB 0 CT2 27 (SUTURE) ×6 IMPLANT
SUT VIC AB 2-0 CT2 27 (SUTURE) ×3 IMPLANT
SUTURE EHLN 3-0 FS-10 30 BLK (SUTURE) ×1 IMPLANT
SUTURE MNCRL 4-0 27XMF (SUTURE) ×1 IMPLANT
SYR 10ML LL (SYRINGE) ×3 IMPLANT
SYR 30ML LL (SYRINGE) ×3 IMPLANT
TISSEEL 4ML HEMOSTATIC FIBRIN (Miscellaneous) ×3 IMPLANT
TRAY FOLEY SLVR 16FR LF STAT (SET/KITS/TRAYS/PACK) ×3 IMPLANT
WIRE Z .045 C-WIRE SPADE TIP (WIRE) ×3 IMPLANT
WIRE Z .062 C-WIRE SPADE TIP (WIRE) ×3 IMPLANT

## 2018-11-08 NOTE — Progress Notes (Signed)
Patient notes dramatic relief of pain after femoral nerve block.   Exam: Gen: resting comfortably in bed LLE:  Brace in place, locked in extension Drain intact + DF/PF/EHL strength SILT grossly over foot. Mild numbness on medial and lateral aspects of leg Compartments of leg are all soft and compressible, no pain with passive stretch of toes.  Feet wwp  32 yo M s/p L MACI implantation to medial femoral condyle and trochlea with concurrent tibial tubercle osteotomy; doing well post op.  POSTOP PLAN: - PT/OT on POD#1 - NWB on operative extremity - Ancef x 24 hours - DVT ppx: ASA 325mg /day starting on POD#1  - Pain control: Tylenol scheduled + oxycodone PO prn + dilaudid iv for breakthrough - Monitor neuro exam overnight. - Plan for DC on POD#1

## 2018-11-08 NOTE — Op Note (Addendum)
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 Left Patellofemoral misalignment  PROCEDURES:  1. Left trochlea matrix autologous chondrocyte implantation 2. Left medial femoral condyle matrix autologous chondrocyte implantation 3. Left knee tibial tubercle osteotomy    SURGEON:Darletta Noblett Molinda Bailiff, MD  ASSISTANT(S): Valeria Batman, PA  ANESTHESIA: Gen + postoperative regional block  TOTAL IV FLUIDS: see anesthesia record  ESTIMATED BLOOD LOSS: 150cc  TOURNIQUET TIME: 76 min  DRAINS: medium 10 Fr Hemovac  SPECIMENS: None.  IMPLANTS:  - 4.67mm cortical screws x 2 - MACI implant x 2 (medial femoral condyle and trochlea)   COMPLICATIONS: None apparent.  INDICATIONS: Benjamin Mcneil is a 32 y.o. male with Left knee pain and patellofemoral pain. Subsequent MRI showed a full-thickness chondral defect of the medial femoral condyle with underlying reactive bone marrow edema.  The patient had patellar malalignment with TT-TG distance of 19 mm.  Patient attempted significant nonoperative management the form of physical therapy, corticosteroid injections, and activity modifications but had continued pain.  Given the patient's activity level and full-thickness chondral defect, surgery was recommended for MACI biopsy and confirm the imaging.  In addition to the grade 4 medial femoral condyle lesion, there was also a grade 4 trochlear defect.  Therefore plan was adjusted to include MACI implantation of trochlea and to perform a tibial tubercle osteotomy to improve patella alignment and unload the trochlea.  This was in addition to the previously planned MACI implantation of the medial femoral condyle.  Risks, benefits, and alternatives to the surgery were explained to the  patient, and the patient elected to proceed.  We discussed the rehabilitation protocol at length as well as the weightbearing and range of motion restrictions prior to surgery, and the patient agreed to be compliant with these.  DETAILS OF PROCEDURE: The patient was identified in the preoperative holding area and the correct operative extremity was marked and consent was verified.  He was then transferred to the operating room. He was placed supine on the OR table.  A bump was placed under the hip and a tourniquet was placed on the thigh. The operative extremity was prescrubbed with Hibiclens and alcohol, prepped with ChloraPrep, and draped in the usual sterile fashion. The patient was given preoperative IV antibiotics within 30 minutes of the start of the case and a surgical time-out confirming patient identity, procedure, and laterality was performed.   A longitudinal incision was marked from just proximal patella to ~8cm distal to the tibial tubercle.  The leg was elevated and exsanguinated with an Esmarch bandage and tourniquet inflated to 250 mmHg.  A 10 blade was used to make the long central knee incision from the proximal pole of the patella to 8cm distal to the tibial tubercle.  Medial and lateral flaps were developed.    Next a tibial tubercle osteotomy was performed. The anterior compartment musculature was released subperiosteally and protected. Guidewires were used to estimate trajectory of osteotomy. An oscillating saw was placed at a 45-degree angle from medial to lateral, anterior to posterior. The saw was then used to make the 45 degree cut. The distal aspect of the shingle was left intact. Osteotomes were used proximally to finish the lateral and medial cuts on the shingle.  The shingle was appropriately mobile.  A medial parapatellar arthrotomy  was performed from just proximal to the patella through the quadriceps tendon along the patella down to the insertion of the patellar tendon on  the tubercle.  It was taken to avoid cutting the medial meniscus.  The trochlear defect was identified after the patella was subluxed laterally. Surrounding cartilage was significantly damaged.   There were no focal lesions of the patella that required treatment.  A fresh marking pen was used to outline the edges of the trochlear defect.  A 15 x 23 mm oval cutting guide fit the trochlear defect well. This cutting guide measured 2.7cm^2 in area. The cutting guide was impacted into the area of the damaged cartilage with a mallet.  Ring curettes and periosteal elevators were used to remove the damaged cartilage. The trochlear lesion was well shouldered with vertical walls.  Next, the medial femoral condyle chondral defect was also identified, and it also had significant damage to the surrounding cartilage.  This lesion was prepared similar to the trochlear defect.  The same 15 x 23 mm oval cutting guide fit the medial femoral condyle defect appropriately as well.  The tourniquet was lowered at 76 minutes and appropriate hemostasis was achieved, taking care to ensure that the lesion beds were not bleeding. The wound was thoroughly irrigated. The membrane was placed cell side up over the template/Tegaderm and not touched with hands or forceps. The membrane was cut with the same sized oval cutting guide for both lesions.  Tisseel glue was applied over the subchondral bone. The membrane with chondrocytes was placed over the medial femoral condyle bone defect first, cell side down toward bone.   Gentle pressure was held with Q-tips and the Tisseel glue was allowed to set for 3 minutes.  Next, Tisseel glue was applied to the edges of the lesion/membrane. The membrane was held in this position for an additional 3 minutes to allow the Tisseel glue to set.  This process was repeated for the trochlear lesion as well.  Next, we turned our attention to fixation of the tibial tubercle osteotomy.  The proximal aspect of the  tibial tubercle was then elevated and shifted medially ~69mm, which also anteriorized the shingle 10mm.  Two, fully-threaded cortical screws, 4.5 mm in diameter were then placed in a lag fashion with the proximal screw measuring 58mm, and the distal screw measuring 50mm.  The countersink was used to limit the prominence of the screw heads.  Fluoroscopy was used to confirm appropriate position of hardware and appropriate medial shift of the tibial tubercle.  There was no unusual bleeding.  The distal aspect of the wound was gently irrigated.  The medial overhang of the tibial tubercle osteotomy was smoothed with a saw blade.  Bone wax was applied in a thin layer to limit cancellous bleeding over nonhealing bony surfaces.  A medium Hemovac drain was placed along the tibial tubercle osteotomy site with care taken to ensure that the drain remained distal to the knee joint. The parapatellar arthrotomy was closed with figure-of-eight stitches of 0 Vicryl. The midline knee incision was then closed with interrupted, inverted, 2-0 Vicryl sutures in the subdermal layer and then 4-0 Monocryl in a running, subcuticular fashion.  Dermabond was applied, and Honeycomb dressing was applied. Leg was wrapped in cotton and bias wrap.  Polar Care and hinged knee brace locked at 0 degrees was applied.  The patient was brought to PACU in stable condition.  Instrument, sponge, and needle counts were correct prior to wound closure and at the  conclusion of the case.         Additionally, this case required increased surgical complexity given that the patient had 2 chondral defects at 2 different sites about the knee.  This required 2 different preparations of the lesion as well as 2 implantations, leading to increased surgical time by approximately 30 minutes.  Of note, assistance from a PA was essential to performing the surgery. PA assisted with patient positioning, retraction, and instrumentation. The surgery would have been more  difficult and had longer operative time without PA assistance.   DISPOSITION: PACU - hemodynamically stable.  POSTOPERATIVE PLAN: The patient will be discharged home tomorrow after overnight stay for drain monitoring as well as monitoring for compartment syndrome.  Antibiotics will be given overnight, but discontinued within 24 hours of surgery.  ASA 325 mg/daily for DVT prophylaxis x 4 weeks.  The patient will be non-weight bearing for 4 weeks, and then may 50% weight bear from weeks 4 to 6, and may fully weight bear at week 6.  Brace will be locked in extension initially to protect the patellofemoral joint.  Sickle therapy to start on post-op day 3-4.  Return to clinic 10-14 days as scheduled.

## 2018-11-08 NOTE — Anesthesia Post-op Follow-up Note (Signed)
Anesthesia QCDR form completed.        

## 2018-11-08 NOTE — Transfer of Care (Signed)
Immediate Anesthesia Transfer of Care Note  Patient: Benjamin Mcneil  Procedure(s) Performed: OSTEOCHONDRAL DEFECT REPAIR/RECONSTRUCTION, MACI IMPLANTATION TO MEDIAL CONDYLE, TROCHLEA tibial tuburcle osteotomy (Left )  Patient Location: PACU  Anesthesia Type:General  Level of Consciousness: patient cooperative  Airway & Oxygen Therapy: Patient Spontanous Breathing and Patient connected to face mask oxygen  Post-op Assessment: Report given to RN and Post -op Vital signs reviewed and stable  Post vital signs: Reviewed and stable  Last Vitals:  Vitals Value Taken Time  BP 147/90 11/08/2018 11:50 AM  Temp    Pulse 84 11/08/2018 11:51 AM  Resp 13 11/08/2018 11:51 AM  SpO2 100 % 11/08/2018 11:51 AM  Vitals shown include unvalidated device data.  Last Pain:  Vitals:   11/08/18 0616  TempSrc: Oral  PainSc: 0-No pain         Complications: No apparent anesthesia complications

## 2018-11-08 NOTE — H&P (Signed)
Paper H&P to be scanned into permanent record. H&P reviewed.  I called the patient yesterday after reviewing his imaging and his case in advance of surgery today.  He has an elevated TT-TG distance of approximately 19 mm.  Given that he has a grade 4 chondral defect of the trochlea for which we are planning on performing MACI today, it would likely be beneficial for the patient to undergo a tibial tubercle osteotomy with anterior medialization of the tibial tubercle.  This would allow for offloading the patellofemoral joint and provide a better environment for his cartilage surfaces.  I have reviewed the risks, benefits, and alternatives to this procedure with the patient including but not limited to, the increased risk of bleeding, hematoma, muscle/nerve damage, hardware failure/prominence, and postoperative stiffness.  The patient is in agreement to proceed with tibial tubercle osteotomy in addition to implantation of MACI to medial femoral condyle and trochlea in order to give him the best chance at a durable long-term outcome.

## 2018-11-08 NOTE — Anesthesia Procedure Notes (Signed)
Anesthesia Regional Block: Femoral nerve block   Pre-Anesthetic Checklist: ,, timeout performed, Correct Patient, Correct Site, Correct Laterality, Correct Procedure, Correct Position, site marked, Risks and benefits discussed,  Surgical consent,  Pre-op evaluation,  At surgeon's request and post-op pain management  Laterality: Left  Prep: chloraprep       Needles:  Injection technique: Single-shot  Needle Type: Stimiplex     Needle Length: 10cm  Needle Gauge: 20     Additional Needles:   Procedures:,,,, ultrasound used (permanent image in chart),,,,  Narrative:  Start time: 11/08/2018 12:50 PM End time: 11/08/2018 12:58 PM Injection made incrementally with aspirations every 5 mL.  Performed by: Personally  Anesthesiologist: Alver Fisher, MD  Additional Notes: Functioning IV was confirmed and monitors were applied.  A Stimuplex needle was used. Sterile prep and drape,hand hygiene and sterile gloves were used.  Negative aspiration and negative test dose prior to incremental administration of local anesthetic. The patient tolerated the procedure well.

## 2018-11-08 NOTE — Anesthesia Preprocedure Evaluation (Signed)
Anesthesia Evaluation  Patient identified by MRN, date of birth, ID band Patient awake    Reviewed: Allergy & Precautions, NPO status , Patient's Chart, lab work & pertinent test results  History of Anesthesia Complications Negative for: history of anesthetic complications  Airway Mallampati: II  TM Distance: >3 FB Neck ROM: Full    Dental  (+) Implants   Pulmonary asthma , neg sleep apnea,    breath sounds clear to auscultation- rhonchi (-) wheezing      Cardiovascular Exercise Tolerance: Good (-) hypertension(-) CAD, (-) Past MI, (-) Cardiac Stents and (-) CABG  Rhythm:Regular Rate:Normal - Systolic murmurs and - Diastolic murmurs    Neuro/Psych negative neurological ROS  negative psych ROS   GI/Hepatic negative GI ROS, Neg liver ROS,   Endo/Other  negative endocrine ROSneg diabetes  Renal/GU negative Renal ROS     Musculoskeletal negative musculoskeletal ROS (+)   Abdominal (+) + obese,   Peds  Hematology negative hematology ROS (+)   Anesthesia Other Findings Past Medical History: No date: Asthma   Reproductive/Obstetrics                             Anesthesia Physical Anesthesia Plan  ASA: II  Anesthesia Plan: General   Post-op Pain Management:  Regional for Post-op pain   Induction: Intravenous  PONV Risk Score and Plan: 1 and Ondansetron, Dexamethasone and Midazolam  Airway Management Planned: Oral ETT  Additional Equipment:   Intra-op Plan:   Post-operative Plan: Extubation in OR  Informed Consent: I have reviewed the patients History and Physical, chart, labs and discussed the procedure including the risks, benefits and alternatives for the proposed anesthesia with the patient or authorized representative who has indicated his/her understanding and acceptance.   Dental advisory given  Plan Discussed with: CRNA and Anesthesiologist  Anesthesia Plan Comments:          Anesthesia Quick Evaluation

## 2018-11-08 NOTE — Progress Notes (Signed)
Chaplain responded to an OR for an AD. Chaplain educated Pt and left brochure.    11/08/18 1500  Clinical Encounter Type  Visited With Patient  Visit Type Initial  Referral From Nurse  Spiritual Encounters  Spiritual Needs Brochure

## 2018-11-08 NOTE — Anesthesia Postprocedure Evaluation (Signed)
Anesthesia Post Note  Patient: Benjamin Mcneil  Procedure(s) Performed: OSTEOCHONDRAL DEFECT REPAIR/RECONSTRUCTION, MACI IMPLANTATION TO MEDIAL CONDYLE, TROCHLEA tibial tuburcle osteotomy (Left )  Patient location during evaluation: PACU Anesthesia Type: General Level of consciousness: awake and alert and oriented Pain management: pain level controlled Vital Signs Assessment: post-procedure vital signs reviewed and stable Respiratory status: spontaneous breathing, nonlabored ventilation and respiratory function stable Cardiovascular status: blood pressure returned to baseline and stable Postop Assessment: no signs of nausea or vomiting Anesthetic complications: no     Last Vitals:  Vitals:   11/08/18 1306 11/08/18 1321  BP: (!) 144/85   Pulse: 69   Resp: (!) 21 20  Temp:    SpO2: 100%     Last Pain:  Vitals:   11/08/18 1306  TempSrc:   PainSc: 0-No pain                 Tayana Shankle

## 2018-11-08 NOTE — Progress Notes (Signed)
Pt arrived to room 142 from PACU. Mother at bedside. Pt alert and oriented X4. Pt denies pain at this time. IV infusing NS -54ml/hr. Stood beside bed and urinated.  Pt on room air. Skin assessment completed with Laureen Ochs, RN. Polar care on and running. Phone and call bell within reach. No questions from pt at this time.

## 2018-11-08 NOTE — Anesthesia Procedure Notes (Signed)
Procedure Name: Intubation Date/Time: 11/08/2018 7:50 AM Performed by: Allean Found, CRNA Pre-anesthesia Checklist: Patient identified, Patient being monitored, Timeout performed, Emergency Drugs available and Suction available Patient Re-evaluated:Patient Re-evaluated prior to induction Oxygen Delivery Method: Circle system utilized Preoxygenation: Pre-oxygenation with 100% oxygen Induction Type: IV induction Ventilation: Mask ventilation without difficulty Laryngoscope Size: Mac and 4 Grade View: Grade I Tube type: Oral Tube size: 7.5 mm Number of attempts: 1 Airway Equipment and Method: Stylet Placement Confirmation: ETT inserted through vocal cords under direct vision,  positive ETCO2 and breath sounds checked- equal and bilateral Secured at: 21 cm Tube secured with: Tape Dental Injury: Teeth and Oropharynx as per pre-operative assessment

## 2018-11-09 ENCOUNTER — Encounter: Payer: Self-pay | Admitting: Orthopedic Surgery

## 2018-11-09 DIAGNOSIS — M948X6 Other specified disorders of cartilage, lower leg: Secondary | ICD-10-CM | POA: Diagnosis not present

## 2018-11-09 MED ORDER — METHOCARBAMOL 500 MG PO TABS: 500.0000 mg | ORAL_TABLET | Freq: Four times a day (QID) | ORAL | 0 refills | Status: DC | PRN

## 2018-11-09 MED ORDER — ASPIRIN 325 MG PO TBEC: 325.0000 mg | DELAYED_RELEASE_TABLET | Freq: Every day | ORAL | 0 refills | Status: DC

## 2018-11-09 MED ORDER — ONDANSETRON HCL 4 MG PO TABS: 4.0000 mg | ORAL_TABLET | Freq: Four times a day (QID) | ORAL | 0 refills | Status: DC | PRN

## 2018-11-09 MED ORDER — OXYCODONE HCL 5 MG PO TABS: 5.0000 mg | ORAL_TABLET | ORAL | 0 refills | Status: DC | PRN

## 2018-11-09 NOTE — Discharge Summary (Signed)
Physician Discharge Summary  Subjective: 1 Day Post-Op Procedure(s) (LRB): OSTEOCHONDRAL DEFECT REPAIR/RECONSTRUCTION, MACI IMPLANTATION TO MEDIAL CONDYLE, TROCHLEA tibial tuburcle osteotomy (Left) Patient reports pain as mild.   Patient seen in rounds with Dr. Allena Katz. Patient is well, and has had no acute complaints or problems Patient is ready to go home  Physician Discharge Summary  Patient ID: Benjamin Mcneil MRN: [id number] DOB/AGE: 1986/01/26 32 y.o.  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  He is doing well since surgery.  He is in his hinged range of motion brace that is locked.  The patient did well last night and has physical therapy today before going home.  Treatments: surgery:  1. Left medial femoral condyle, trochlea chondroplasty 2. Left knee autologous chondrocyte biopsy  SURGEON:  Novella Olive, MD  ASSISTANT(S):  none  ANESTHESIA: Gen w/LMA  TOTAL IV FLUIDS: See anesthesia record  ESTIMATED BLOOD LOSS: Minimal  TOURNIQUET TIME:  36 min.  DRAINS:  None.  SPECIMENS: Autologous chondrocyte biopsy sent to [facility].  IMPLANTS: None.  COMPLICATIONS: none  Discharge Exam: Blood pressure 128/73, pulse 97, temperature 97.9 F (36.6 C), temperature source Oral, resp. rate 18, height 5\' 9"  (1.753 m), weight 96.6 kg, SpO2 96 %.   Disposition: Discharge disposition: 01-Home or Self Care        Allergies as of 11/09/2018   No Known Allergies     Medication List    STOP taking these medications   HYDROcodone-acetaminophen 5-325 MG tablet Commonly known as:  NORCO/VICODIN     TAKE these medications   acetaminophen 325 MG tablet Commonly  known as:  TYLENOL Take 650 mg by mouth every 6 (six) hours as needed for moderate pain or headache.   albuterol 108 (90 Base) MCG/ACT inhaler Commonly known as:  PROVENTIL HFA;VENTOLIN HFA Inhale 2 puffs into the lungs every 6 (six) hours as needed for wheezing or shortness of breath.   aspirin 325 MG EC tablet Take 1 tablet (325 mg total) by mouth daily.   methocarbamol 500 MG tablet Commonly known as:  ROBAXIN Take 1 tablet (500 mg total) by mouth every 6 (six) hours as needed for muscle spasms.   ondansetron 4 MG disintegrating tablet Commonly known as:  ZOFRAN-ODT Take 1 tablet (4 mg total) by mouth every 8 (eight) hours as needed for nausea or vomiting.   ondansetron 4 MG tablet Commonly known as:  ZOFRAN Take 1 tablet (4 mg total) by mouth every 6 (six) hours as needed for nausea.   oxyCODONE 5 MG immediate release tablet Commonly known as:  Oxy IR/ROXICODONE Take 1-2 tablets (5-10 mg total) by mouth every 4 (four) hours as needed for breakthrough pain.      Follow-up Information    Signa Kell, MD. Go in 2 week(s).   Specialty:  Orthopedic Surgery Contact information: [address] [place] [address] [phone]           Signed: Dedra Skeens 11/09/2018, 6:21 AM   Objective: Vital signs in last 24 hours: Temp:  [97.9 F (36.6 C)-99.3 F (37.4 C)] 97.9 F (36.6 C) (11/11 2329) Pulse Rate:  [64-117] 97 (11/11 2329) Resp:  [13-21] 18 (11/11 2329) BP: (114-154)/(73-103) 128/73 (11/11 2329) SpO2:  [95 %-100 %] 96 % (  11/11 2329)  Intake/Output from previous day:  Intake/Output Summary (Last 24 hours) at 11/09/2018 0621 Last data filed at 11/09/2018 0612 Gross per 24 hour  Intake 3318.79 ml  Output 2220 ml  Net 1098.79 ml    Intake/Output this shift: Total I/O In: 1622.4 [P.O.:480; I.V.:942.5; IV Piggyback:199.9] Out: 800 [Urine:800]  Labs: No results for input(s): HGB in the last 72 hours. No results for input(s): WBC, RBC, HCT,  PLT in the last 72 hours. No results for input(s): NA, K, CL, CO2, BUN, CREATININE, GLUCOSE, CALCIUM in the last 72 hours. No results for input(s): LABPT, INR in the last 72 hours.  EXAM: General - Patient is Alert and Oriented Extremity - Neurologically intact Sensation intact distally Compartment soft Incision - clean, dry, with the Hemovac removed Motor Function -plantarflexion and dorsiflexion are intact.  Assessment/Plan: 1 Day Post-Op Procedure(s) (LRB): OSTEOCHONDRAL DEFECT REPAIR/RECONSTRUCTION, MACI IMPLANTATION TO MEDIAL CONDYLE, TROCHLEA tibial tuburcle osteotomy (Left) Procedure(s) (LRB): OSTEOCHONDRAL DEFECT REPAIR/RECONSTRUCTION, MACI IMPLANTATION TO MEDIAL CONDYLE, TROCHLEA tibial tuburcle osteotomy (Left) Past Medical History:  Diagnosis Date  . 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   Weight as of this encounter: 96.6 kg. Advance diet Up with therapy D/C IV fluids Diet - Regular diet Follow up - in 2 weeks Activity - NWB Disposition - Home Condition Upon Discharge - Good DVT Prophylaxis - Aspirin  Dedra Skeensodd Brylea Pita, PA-C Orthopaedic Surgery 11/09/2018, 6:21 AM

## 2018-11-09 NOTE — Evaluation (Signed)
Physical Therapy Evaluation Patient Details Name: Benjamin Mcneil MRN: [id number] DOB: 1986-09-08 Today's Date: 11/09/2018   History of Present Illness  Pt is a 32 yo male with a diagnosis of left trochlea chondral defect, Left medial femoral condyle chondral defect, and Left Patellofemoral misalignment who is now s/p Left trochlea matrix autologous chondrocyte implantation, Left medial femoral condyle matrix autologous chondrocyte implantation, and Left knee tibial tubercle osteotomy.    Clinical Impression  Pt presents with mild deficits in strength, transfers, mobility, gait, balance, and activity tolerance but overall performed well during PT evaluation.  Pt's SpO2 and HR WNL on room air during the session with no adverse symptoms reported other than mild fatigue after amb.  Pt was Mod Ind with bed mobility tasks with extra time and effort to get the LLE in/out of bed.  Pt required SBA with transfers with verbal cues for proper sequencing practicing with both crutches and RW.  Pt was able to amb 1 x 200' and 1 x 60' using a combination of crutches and a RW with cues for proper sequencing during training to ensure LLE WB status compliance.  Pt presented with min instability with the crutches but was able to self correct ambulating with a slow, cautious cadence.  Pt reported feeling much safer with the RW and was able to demonstrate improved stability, confidence, and cadence during training with the RW compared to the crutches.  Pt will benefit from HHPT services upon discharge to safely address above deficits for decreased caregiver assistance and eventual return to PLOF.      Follow Up Recommendations Home health PT    Equipment Recommendations  Rolling walker with 5" wheels;3in1 (PT)    Recommendations for Other Services       Precautions / Restrictions Precautions Precautions: Fall Required Braces or Orthoses: Knee Immobilizer - Left Knee Immobilizer - Left: On at all times;Other  (comment)(Hinged knee brace locked in extension) Restrictions Weight Bearing Restrictions: Yes LLE Weight Bearing: Non weight bearing Other Position/Activity Restrictions: NWB x 4 weeks and then 50% PWB x 4-6 wks      Mobility  Bed Mobility Overal bed mobility: Modified Independent             General bed mobility comments: Extra time and effort but no physical assistance required  Transfers Overall transfer level: Needs assistance Equipment used: Rolling walker (2 wheeled);Crutches Transfers: Sit to/from Stand Sit to Stand: Supervision         General transfer comment: Min verbal cues for sequencing for hand placement, proper crutch management, and to ensure LLE WB status compliance maintained  Ambulation/Gait Ambulation/Gait assistance: Supervision Gait Distance (Feet): 200 Feet Assistive device: Rolling walker (2 wheeled);Crutches   Gait velocity: Decreased   General Gait Details: Min-mod verbal cues for sequencing with B axillary crutches and with RW  Stairs            Wheelchair Mobility    Modified Rankin (Stroke Patients Only)       Balance Overall balance assessment: No apparent balance deficits (not formally assessed)                                           Pertinent Vitals/Pain Pain Assessment: 0-10 Pain Score: 2  Pain Location: L knee Pain Descriptors / Indicators: Aching;Sore Pain Intervention(s): Premedicated before session;Monitored during session    Home Living Family/patient expects to be discharged  to:: Private residence Living Arrangements: Alone Available Help at Discharge: Family;Available 24 hours/day(Mother and sister-in-law) Type of Home: House Home Access: Level entry     Home Layout: Two level;Able to live on main level with bedroom/bathroom Home Equipment: Crutches      Prior Function Level of Independence: Independent         Comments: Pt Ind with amb without AD community distances, works  Teacher, English as a foreign languageT for [employer], no fall history, Ind with all ADLs and IADLs     Hand Dominance        Extremity/Trunk Assessment   Upper Extremity Assessment Upper Extremity Assessment: Overall WFL for tasks assessed    Lower Extremity Assessment Lower Extremity Assessment: Generalized weakness;LLE deficits/detail LLE: Unable to fully assess due to immobilization    Cervical / Trunk Assessment Cervical / Trunk Assessment: Normal  Communication   Communication: No difficulties  Cognition Arousal/Alertness: Awake/alert Behavior During Therapy: WFL for tasks assessed/performed Overall Cognitive Status: Within Functional Limits for tasks assessed                                        General Comments      Exercises Other Exercises Other Exercises: Transfer training to/from various surfaces with crutches and with RW with min verbal cues for sequencing to ensure WB status compliance Other Exercises: Gait training with both crutches and with RW for proper sequencing with each and to ensure WB status compliance   Assessment/Plan    PT Assessment Patient needs continued PT services  PT Problem List Decreased strength;Decreased activity tolerance;Decreased balance;Decreased mobility;Decreased knowledge of use of DME       PT Treatment Interventions DME instruction;Gait training;Stair training;Functional mobility training;Balance training;Therapeutic exercise;Therapeutic activities;Patient/family education    PT Goals (Current goals can be found in the Care Plan section)  Acute Rehab PT Goals Patient Stated Goal: To return home PT Goal Formulation: With patient Time For Goal Achievement: 11/22/18 Potential to Achieve Goals: Good    Frequency BID   Barriers to discharge        Co-evaluation               AM-PAC PT "6 Clicks" Daily Activity  Outcome Measure Difficulty turning over in bed (including adjusting bedclothes, sheets and blankets)?: A Little Difficulty  moving from lying on back to sitting on the side of the bed? : A Little Difficulty sitting down on and standing up from a chair with arms (e.g., wheelchair, bedside commode, etc,.)?: A Little Help needed moving to and from a bed to chair (including a wheelchair)?: A Little Help needed walking in hospital room?: A Little Help needed climbing 3-5 steps with a railing? : A Little 6 Click Score: 18    End of Session Equipment Utilized During Treatment: Gait belt Activity Tolerance: Patient tolerated treatment well Patient left: in chair;with call bell/phone within reach;with chair alarm set;with SCD's reapplied;Other (comment)(Polar care to LLE, SCD to RLE) Nurse Communication: Mobility status PT Visit Diagnosis: Difficulty in walking, not elsewhere classified (R26.2);Muscle weakness (generalized) (M62.81)    Time: [id number] PT Time Calculation (min) (ACUTE ONLY): 50 min   Charges:   PT Evaluation $PT Eval Low Complexity: 1 Low PT Treatments $Gait Training: 8-22 mins        D. Elly ModenaScott Leina Babe PT, DPT 11/09/18, 10:46 AM

## 2018-11-09 NOTE — Progress Notes (Signed)
Pt. Discharged to home via family vehicle. Discharge instructions and medication regimen reviewed at bedside with patient. Pt. verbalizes understanding of instructions and medication regimen. Prescriptions in packet with pt. Patient assessment unchanged from this morning. IV discontinued per policy. Pt assisted into vehicle by NT.

## 2018-11-09 NOTE — Discharge Instructions (Signed)
Post-Op Instructions  1. Bracing or crutches: You will be provided with a long brace (from hip to ankle) and crutches at the surgery center.   2. Ice: You will be provided with a device [facility]) that allows you to ice the affected area effectively.   3. Showering: Incision must remain dry for 5 days. Afterwards, you may shower and gently pat incision dry.  NO submerging wound for 4 weeks. There is an absorbable stitch and you may see the ends of the stitch. If applicable, these will be removed at your first post-operative appointment in 2 weeks.   4. Driving: You will be given specific driving precautions at discharge. Plan on not driving for at least 4 weeks for right knee surgery if you are restricted due to the brace and knee motion. Please note that you are advised NOT to drive while taking narcotic pain medications as you may be impaired and unsafe to drive.  5. Activity: Weight bearing: No weight bearing on the affected leg for 4 weeks, then 50% weight bearing for 2 weeks, then full weight bearing at 6 weeks. Bending the knee is limited and will be guided by the physical therapist. Elevate knee above heart level as much as possible for one week. Avoid standing more than 5 minutes (consecutively) for the first week. No exercise involving the knee until cleared by the surgeon or physical therapist.  Avoid long distance travel for 4 weeks.  6. Medications: - You have been provided a prescription for narcotic pain medicine. After surgery, take 1-2 narcotic tablets every 4 hours if needed for severe pain.  - A prescription for anti-nausea medication will be provided in case the narcotic medicine causes nausea - take 1 tablet every 6 hours only if nauseated.  - Take enteric coated aspirin 325 mg once daily for 4 weeks to prevent blood clots.  -Take tylenol 1000 every 8 hours for pain.  May stop tylenol 5 days after surgery or when you are having minimal pain. -DO NOT TAKE IBUPROFEN, ALEVE or  OTHER NSAIDs as they can interfere with bone healing.  -Take Citracal Maximum Strength (calcium citrate + vitamin D), 2 tabs daily.  If you are taking prescription medication for anxiety, depression, insomnia, muscle spasm, chronic pain, or for attention deficit disorder, you are advised that you are at a higher risk of adverse effects with use of narcotics post-op, including narcotic addiction/dependence, depressed breathing, death. If you use non-prescribed substances: alcohol, marijuana, cocaine, heroin, methamphetamines, etc., you are at a higher risk of adverse effects with use of narcotics post-op, including narcotic addiction/dependence, depressed breathing, death. You are advised that taking > 50 morphine milligram equivalents (MME) of narcotic pain medication per day results in twice the risk of overdose or death. For your prescription provided: oxycodone 5 mg - taking more than 6 tablets per day would result in > 50 morphine milligram equivalents (MME) of narcotic pain medication. Be advised that we will prescribe narcotics short-term, for acute post-operative pain, only 3 weeks for major operations such as knee repair/reconstruction surgeries.   7. Bandages: The physical therapist should change the bandages at the first post-op appointment. If needed, the dressing supplies have been provided to you.  8. Physical Therapy: 2 times per week for the first 4 weeks, then 1-2 times per week from weeks 4-8 post-op. Therapy typically starts on post operative Day 3 or 4. You have been provided an order for physical therapy today and should schedule your appointments in advance  to avoid delay. The therapist will provide home exercises.  9. Work or School: For most, but not all procedures, we advise staying out of work or school for at least 1 to 2 weeks in order to recover from the stress of surgery and to allow time for healing and swelling control. If you need a work or school note this can be  provided.   10. Post-Op Appointments: Your first post-op appointment will be with Dr. Allena KatzPatel in approximately 2 weeks time. Please double check [facility]days and Thursdays) or [place] facility (Wednesdays).   If you find that they have not been scheduled please call the Orthopaedic Appointment front desk at [phone].

## 2018-11-09 NOTE — Progress Notes (Signed)
  Subjective: 1 Day Post-Op Procedure(s) (LRB): OSTEOCHONDRAL DEFECT REPAIR/RECONSTRUCTION, MACI IMPLANTATION TO MEDIAL CONDYLE, TROCHLEA tibial tuburcle osteotomy (Left) Patient reports pain as mild.   Patient seen in rounds with Dr. Allena Katz. Patient is well, and has had no acute complaints or problems Plan is to go Home after hospital stay. Negative for chest pain and shortness of breath Fever: no Gastrointestinal: Negative for nausea and vomiting  Objective: Vital signs in last 24 hours: Temp:  [97.2 F (36.2 C)-99.3 F (37.4 C)] 97.9 F (36.6 C) (11/11 2329) Pulse Rate:  [64-117] 97 (11/11 2329) Resp:  [12-21] 18 (11/11 2329) BP: (114-154)/(73-103) 128/73 (11/11 2329) SpO2:  [95 %-100 %] 96 % (11/11 2329)  Intake/Output from previous day:  Intake/Output Summary (Last 24 hours) at 11/09/2018 0610 Last data filed at 11/08/2018 2210 Gross per 24 hour  Intake 2640.77 ml  Output 2220 ml  Net 420.77 ml    Intake/Output this shift: Total I/O In: 944.3 [P.O.:480; I.V.:369.2; IV Piggyback:95.1] Out: 800 [Urine:800]  Labs: No results for input(s): HGB in the last 72 hours. No results for input(s): WBC, RBC, HCT, PLT in the last 72 hours. No results for input(s): NA, K, CL, CO2, BUN, CREATININE, GLUCOSE, CALCIUM in the last 72 hours. No results for input(s): LABPT, INR in the last 72 hours.   EXAM General - Patient is Alert and Oriented Extremity - Neurovascular intact Sensation intact distally Dorsiflexion/Plantar flexion intact Compartment soft Dressing/Incision - clean, dry, with the Hemovac removed Motor Function - intact, moving foot and toes well on exam.   Past Medical History:  Diagnosis Date  . 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   Weight as of this encounter: 96.6 kg. Advance diet Up with therapy D/C IV fluids  Plan to discharge home after physical therapy.  DVT Prophylaxis - Aspirin Non-weight-Bearing  to left leg  Dedra Skeens, PA-C Orthopaedic Surgery 11/09/2018, 6:10 AM

## 2018-11-09 NOTE — Care Management (Signed)
This RNCM assuming care of this patient. Notified that rolling walker and bedside commode has been delivered by [facility]. RNCM spoke with patient by phone as he was being discharged. [facility]. He said this has all been arranged. No other RNCM needs.

## 2018-11-10 LAB — HIV ANTIBODY (ROUTINE TESTING W REFLEX): HIV Screen 4th Generation wRfx: NONREACTIVE

## 2018-11-29 IMAGING — MR MR KNEE*L* W/O CM
6 series · 35 of 40 positions shown · non-contrast
Comparison: None.

CLINICAL DATA: Pain on off for 5 years.

EXAM:
MRI OF THE LEFT KNEE WITHOUT CONTRAST
TECHNIQUE: Multiplanar, multisequence MR imaging of the knee was performed. No
intravenous contrast was administered.

[Series 3: PD fat-sat · axial · 3.0mm · 0.56mm/px · z∈[-43,+79]mm · 7 of 38 slices shown (1 of 4)]
[im 1/38]
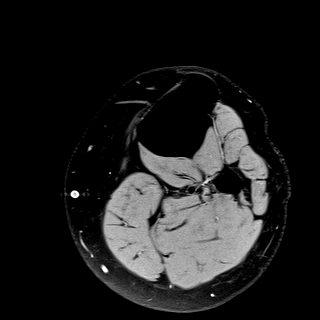
[im 7/38]
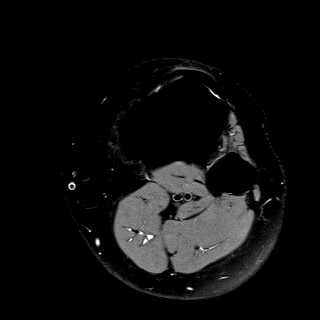
[im 13/38]
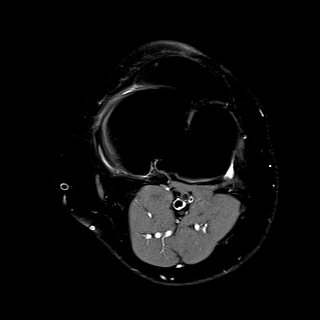
[im 19/38]
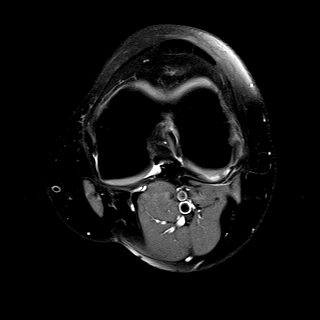
[im 25/38]
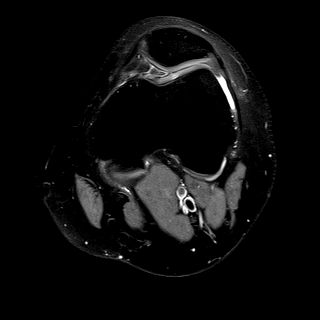
[im 31/38]
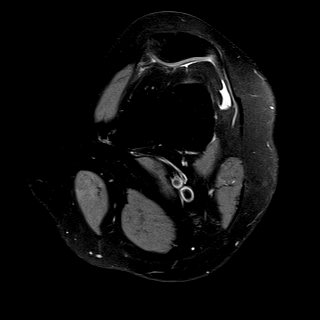
[im 38/38]
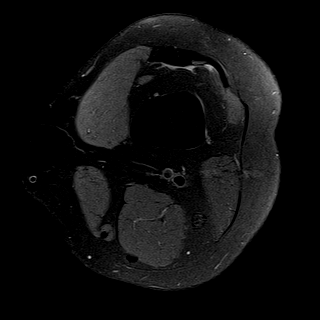

[Series 4: T1 · coronal · 3.0mm · 0.50mm/px · 3 of 40 slices shown]
[im 1/40]
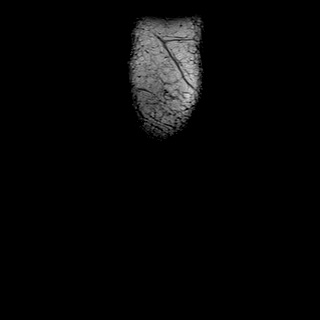
[im 6/40]
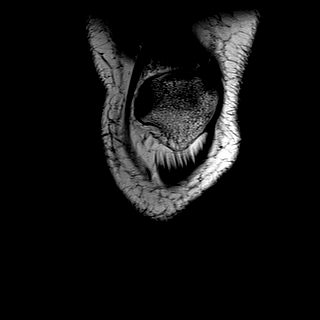
[im 12/40]
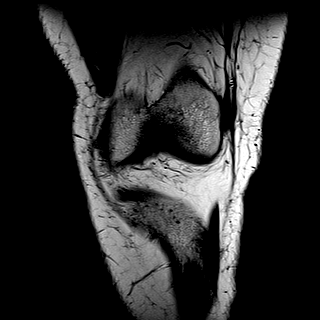

[Series 5: T2 fat-sat · coronal · 3.0mm · 0.31mm/px · 8 of 40 slices shown]
[im 1/40]
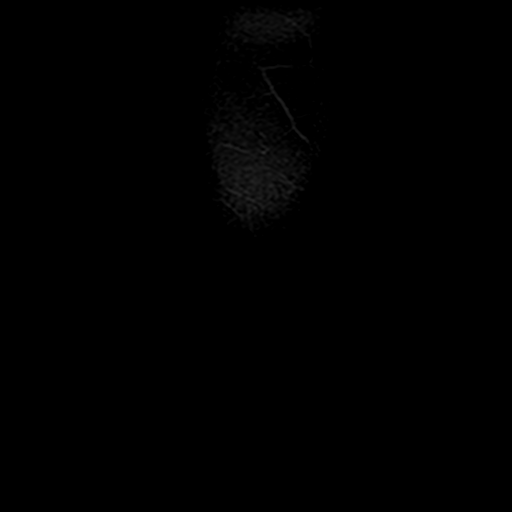
[im 6/40]
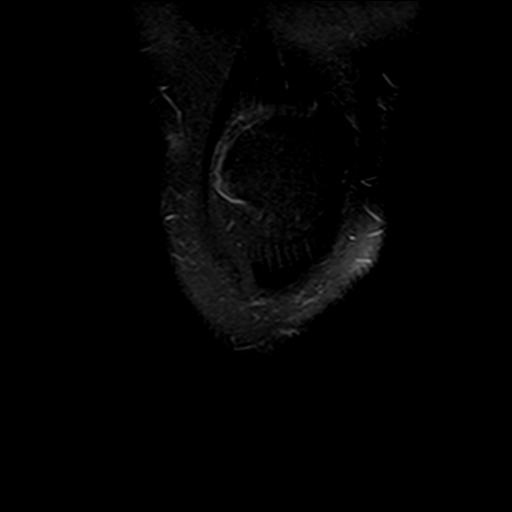
[im 12/40]
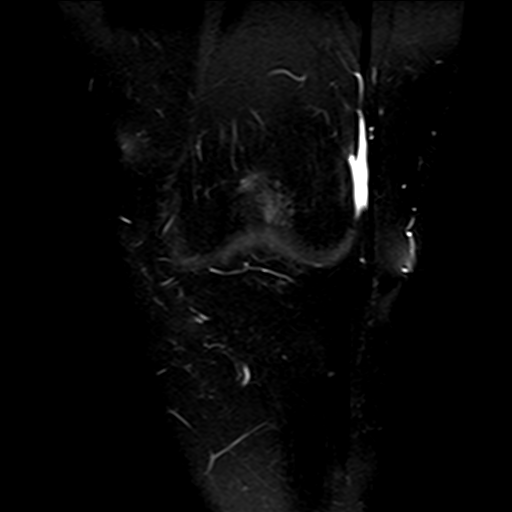
[im 17/40]
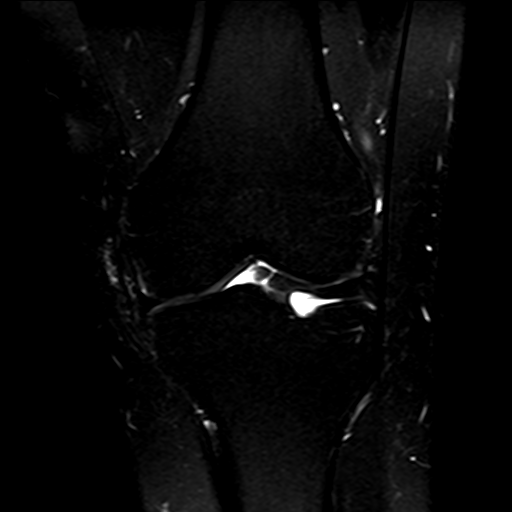
[im 23/40]
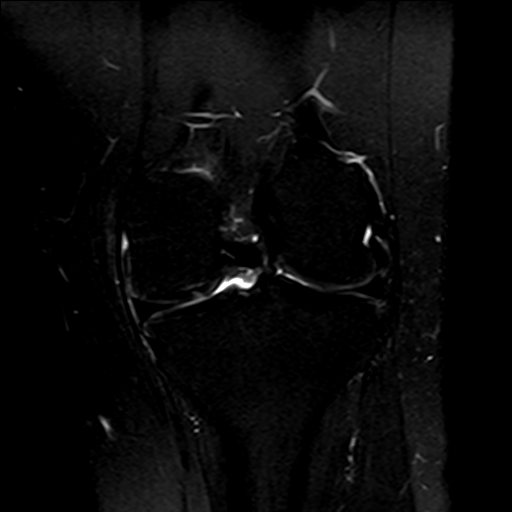
[im 28/40]
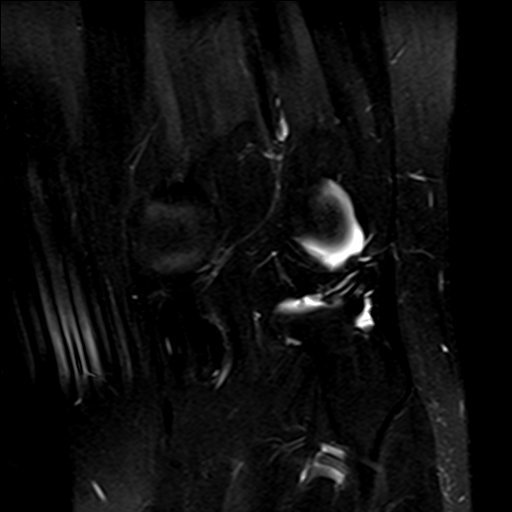
[im 34/40]
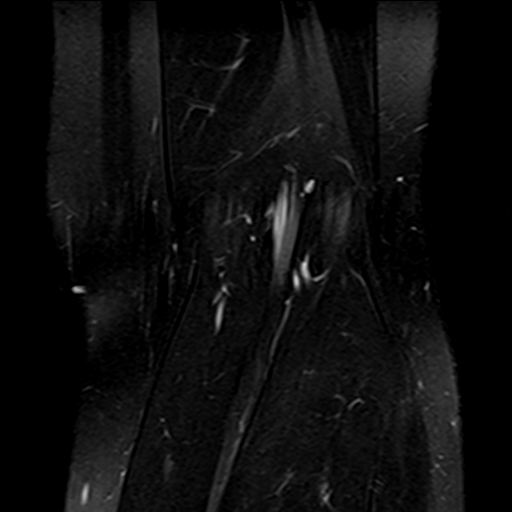
[im 40/40]
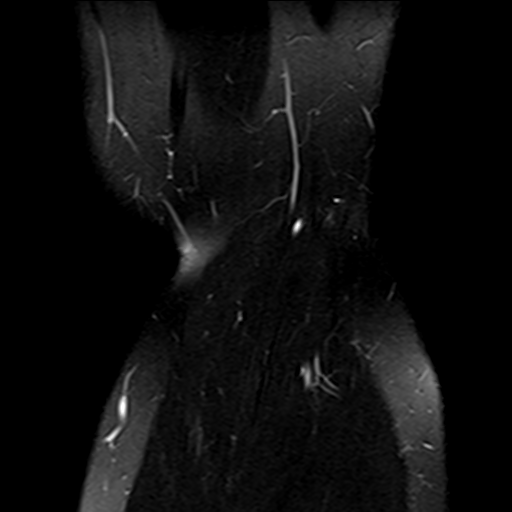

[Series 6: PD fat-sat · coronal · 3.0mm · 0.50mm/px · 8 of 40 slices shown (2 of 4)]
[im 1/40]
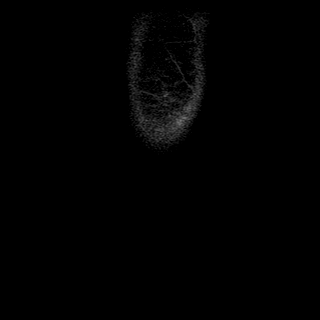
[im 6/40]
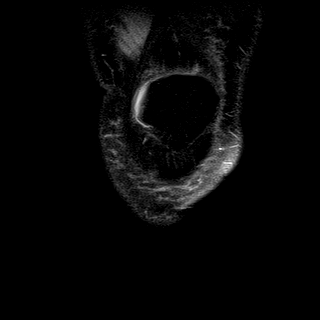
[im 12/40]
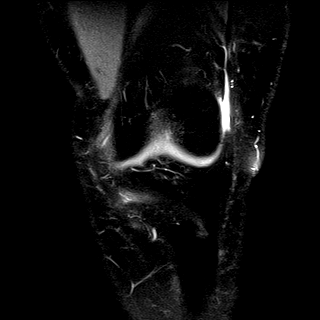
[im 17/40]
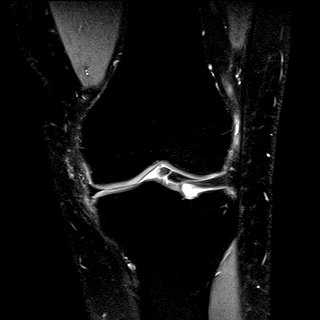
[im 23/40]
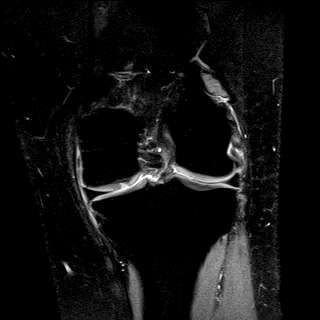
[im 28/40]
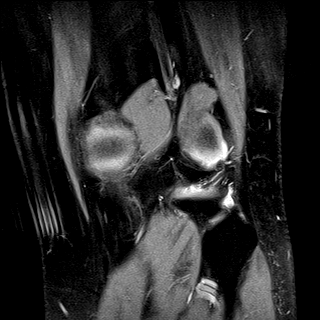
[im 34/40]
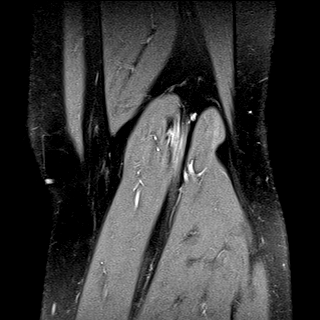
[im 40/40]
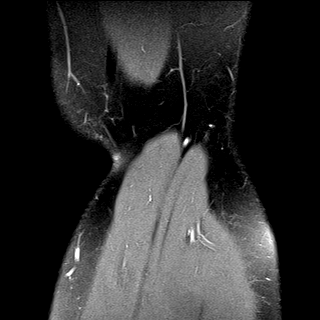

[Series 7: PD fat-sat · sagittal · 3.0mm · 0.56mm/px · 6 of 30 slices shown (3 of 4)]
[im 1/30]
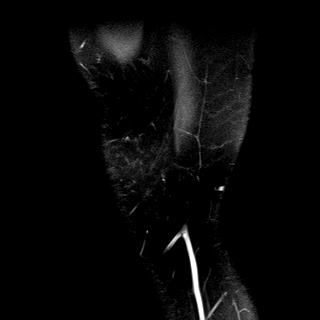
[im 6/30]
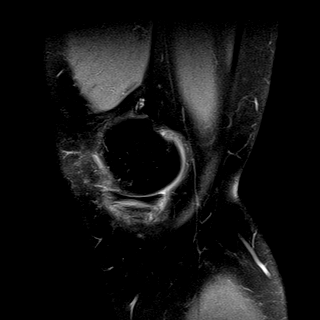
[im 12/30]
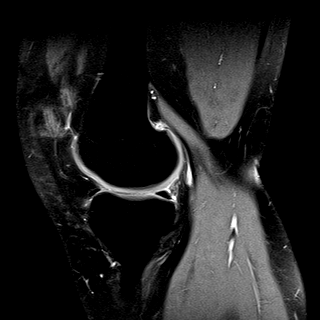
[im 18/30]
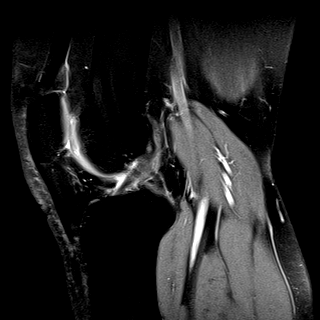
[im 24/30]
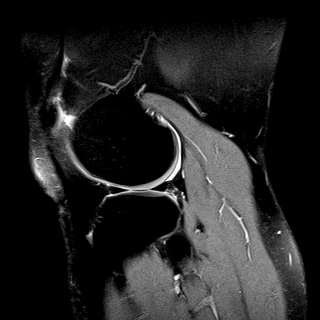
[im 30/30]
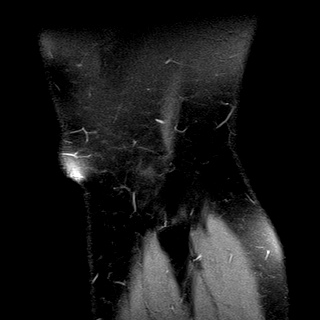

[Series 8: PD fat-sat · oblique · 2.0mm · 0.62mm/px · 3 of 17 slices shown (4 of 4)]
[im 1/17]
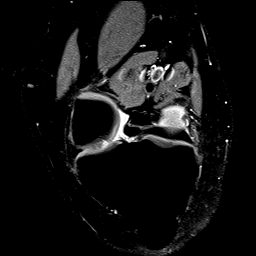
[im 9/17]
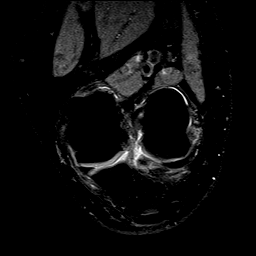
[im 17/17]
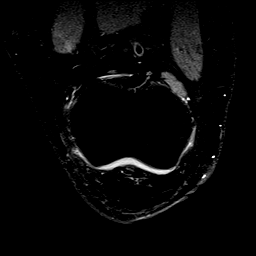

[35 of 40 positions shown; findings below may reference images not displayed]

FINDINGS: MENISCI

Medial meniscus:  Intact.

Lateral meniscus:  Intact.

LIGAMENTS

Cruciates:  Intact ACL and PCL.

Collaterals: Medial collateral ligament is intact. Lateral
collateral ligament complex is intact.

CARTILAGE

Patellofemoral:  Cartilage fissuring of the medial trochlea.

Medial: 4 x 8 mm focal area of cartilage loss involving the medial
femoral condyles weight-bearing surface.

Lateral:  No chondral defect.

Joint:  No joint effusion. Normal Hoffa's fat. No plical thickening.

Popliteal Fossa:  Tiny Baker's cyst. Intact popliteus tendon.

Extensor Mechanism: Intact quadriceps tendon and patellar tendon.
Intact medial and lateral patellar retinaculum. Intact MPFL.

Bones: No focal marrow signal abnormality. No fracture or
dislocation.

Other: No fluid collection or hematoma.
IMPRESSION: 1. No meniscal or ligamentous injury of the left knee.
2. Cartilage fissuring of the medial trochlea.
3. 4 x 8 mm focal area of cartilage loss involving the medial
femoral condyles weight-bearing surface.

## 2018-12-06 IMAGING — CR DG BONE LENGTH
1 series · 5 of 5 positions shown · non-contrast
Comparison: None.

CLINICAL DATA: Left knee pain.  Defect of articular cartilage.

EXAM:
BONE LENGTH

[Series 1: dg bone length · 0.14mm/px · 5 of 5 slices shown]
[im 1/5]
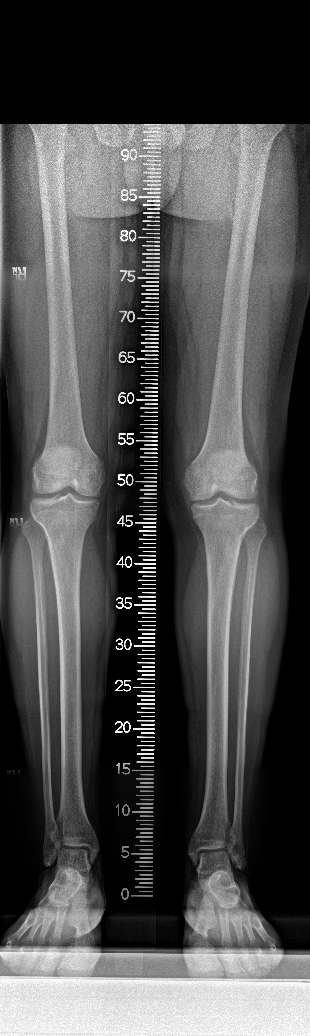
[im 2/5]
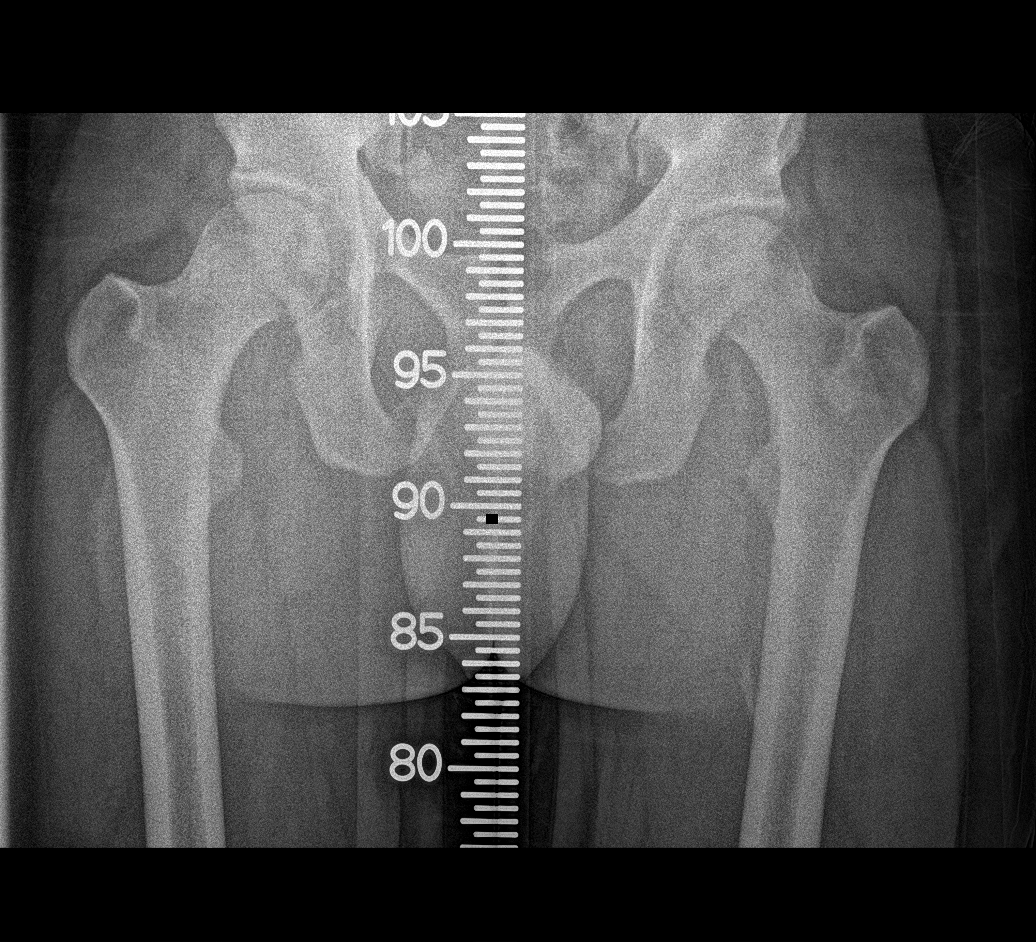
[im 3/5]
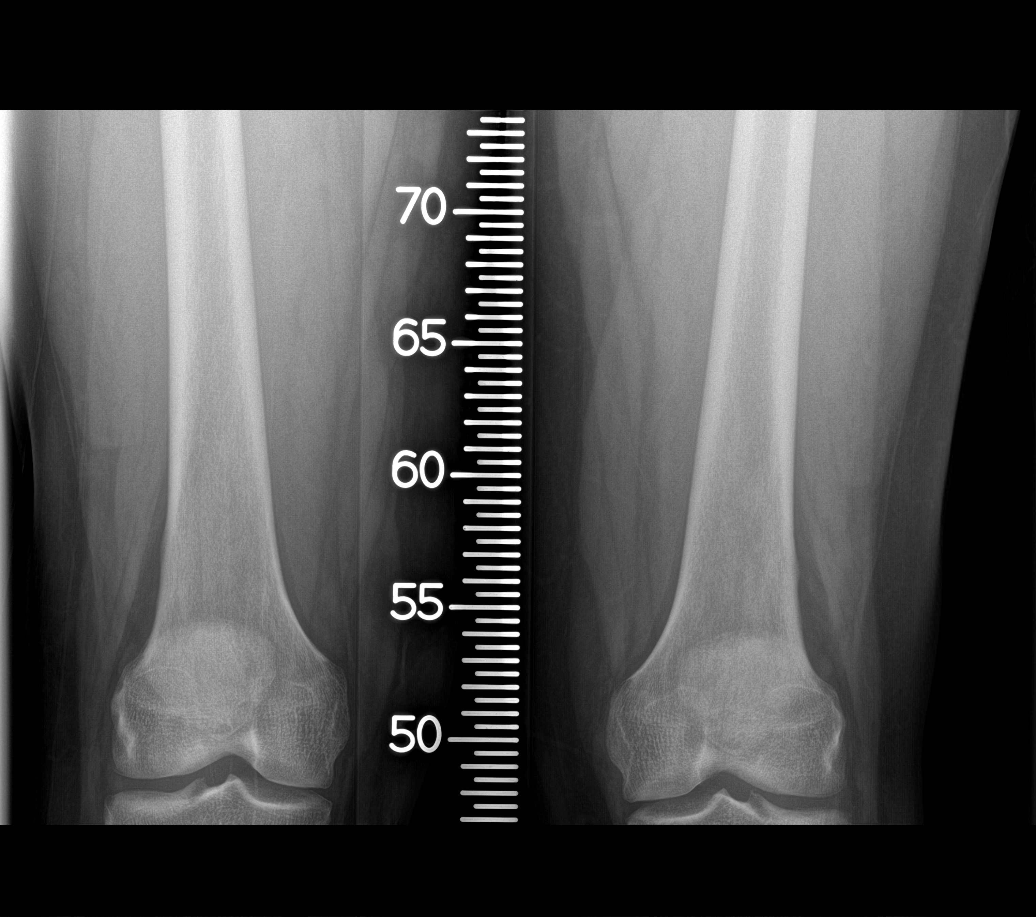
[im 4/5]
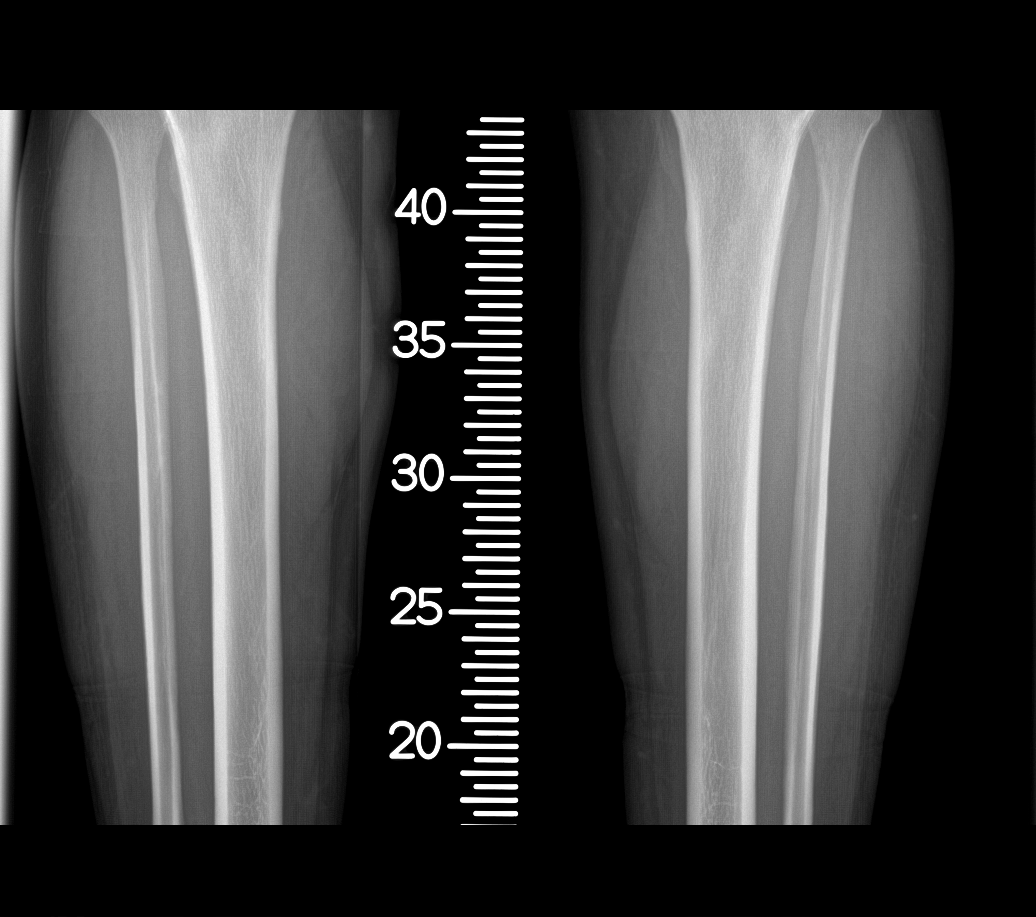
[im 5/5]
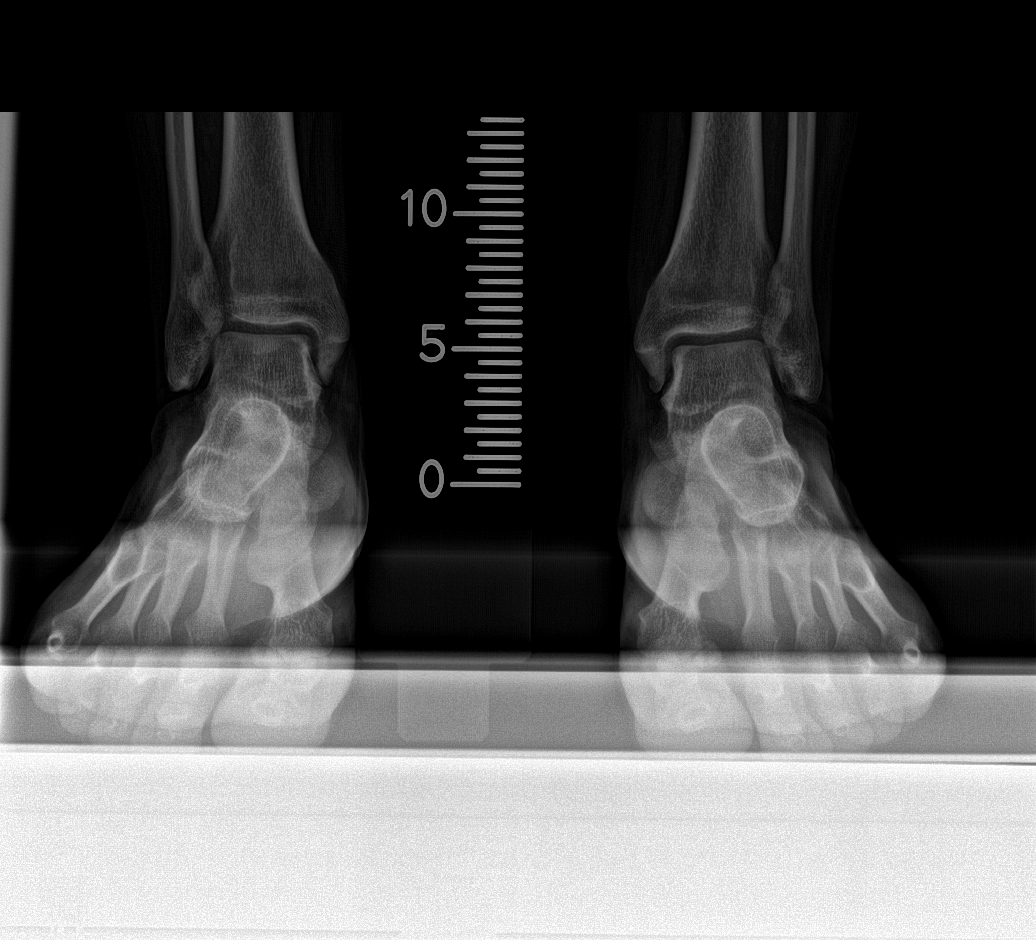

[5 of 5 positions shown; findings below may reference images not displayed]

FINDINGS: The length of the right leg as imaged from top of femoral head
through mid tibial plafond including the knee joint is 99.9 cm.

The length of the left leg as imaged from top of femoral head
through mid tibial plafond including the knee joint is 99.3 cm.
IMPRESSION: Leg length discrepancy of approximately 0.6 cm, left leg shorter
than right.

## 2019-08-25 LAB — HM HIV SCREENING LAB: HM HIV Screening: NEGATIVE

## 2021-04-24 ENCOUNTER — Emergency Department: Payer: BC Managed Care – PPO

## 2021-04-24 ENCOUNTER — Emergency Department
Admission: EM | Admit: 2021-04-24 | Discharge: 2021-04-24 | Disposition: A | Discharge status: Home / Self Care | Payer: BC Managed Care – PPO | Attending: Emergency Medicine | Admitting: Emergency Medicine

## 2021-04-24 ENCOUNTER — Encounter: Payer: Self-pay | Admitting: Emergency Medicine

## 2021-04-24 ENCOUNTER — Other Ambulatory Visit: Payer: Self-pay

## 2021-04-24 DIAGNOSIS — R1011 Right upper quadrant pain: Secondary | ICD-10-CM | POA: Diagnosis present

## 2021-04-24 DIAGNOSIS — R079 Chest pain, unspecified: Secondary | ICD-10-CM | POA: Insufficient documentation

## 2021-04-24 DIAGNOSIS — J45909 Unspecified asthma, uncomplicated: Secondary | ICD-10-CM | POA: Insufficient documentation

## 2021-04-24 DIAGNOSIS — K8020 Calculus of gallbladder without cholecystitis without obstruction: Secondary | ICD-10-CM

## 2021-04-24 DIAGNOSIS — R0602 Shortness of breath: Secondary | ICD-10-CM | POA: Insufficient documentation

## 2021-04-24 DIAGNOSIS — Z7982 Long term (current) use of aspirin: Secondary | ICD-10-CM | POA: Insufficient documentation

## 2021-04-24 LAB — CBC
HCT: 40.7 % (ref 39.0–52.0)
Hemoglobin: 13.4 g/dL (ref 13.0–17.0)
MCH: 29 pg (ref 26.0–34.0)
MCHC: 32.9 g/dL (ref 30.0–36.0)
MCV: 88.1 fL (ref 80.0–100.0)
Platelets: 220 10*3/uL (ref 150–400)
RBC: 4.62 MIL/uL (ref 4.22–5.81)
RDW: 12 % (ref 11.5–15.5)
WBC: 7 10*3/uL (ref 4.0–10.5)
nRBC: 0 % (ref 0.0–0.2)

## 2021-04-24 LAB — HEPATIC FUNCTION PANEL
ALT: 27 U/L (ref 0–44)
AST: 20 U/L (ref 15–41)
Albumin: 4.1 g/dL (ref 3.5–5.0)
Alkaline Phosphatase: 67 U/L (ref 38–126)
Bilirubin, Direct: 0.1 mg/dL (ref 0.0–0.2)
Indirect Bilirubin: 0.9 mg/dL (ref 0.3–0.9)
Total Bilirubin: 1 mg/dL (ref 0.3–1.2)
Total Protein: 7.4 g/dL (ref 6.5–8.1)

## 2021-04-24 LAB — BASIC METABOLIC PANEL
Anion gap: 9 (ref 5–15)
BUN: 12 mg/dL (ref 6–20)
CO2: 29 mmol/L (ref 22–32)
Calcium: 9.2 mg/dL (ref 8.9–10.3)
Chloride: 98 mmol/L (ref 98–111)
Creatinine, Ser: 1.11 mg/dL (ref 0.61–1.24)
GFR, Estimated: >60 mL/min (ref >60)
Glucose, Bld: 110 mg/dL — ABNORMAL HIGH (ref 70–99)
Potassium: 3.5 mmol/L (ref 3.5–5.1)
Sodium: 136 mmol/L (ref 135–145)

## 2021-04-24 LAB — LIPASE, BLOOD: Lipase: 35 U/L (ref 11–51)

## 2021-04-24 LAB — TROPONIN I (HIGH SENSITIVITY): Troponin I (High Sensitivity): 3 ng/L (ref <18)

## 2021-04-24 MED ORDER — ONDANSETRON HCL 4 MG/2ML IJ SOLN
4.0000 mg | Freq: Once | INTRAMUSCULAR | Status: AC
  Administered 2021-04-24: 4 mg via INTRAVENOUS
  Filled 2021-04-24: qty 2

## 2021-04-24 MED ORDER — DICYCLOMINE HCL 20 MG PO TABS: 20.0000 mg | ORAL_TABLET | Freq: Three times a day (TID) | ORAL | 0 refills | Status: DC | PRN

## 2021-04-24 MED ORDER — KETOROLAC TROMETHAMINE 30 MG/ML IJ SOLN
30.0000 mg | Freq: Once | INTRAMUSCULAR | Status: AC
  Administered 2021-04-24: 30 mg via INTRAVENOUS
  Filled 2021-04-24: qty 1

## 2021-04-24 MED ORDER — ONDANSETRON 4 MG PO TBDP: 4.0000 mg | ORAL_TABLET | Freq: Four times a day (QID) | ORAL | 0 refills | Status: DC | PRN

## 2021-04-24 NOTE — Discharge Instructions (Signed)
You may alternate Tylenol 1000 mg every 6 hours as needed for pain, fever and Ibuprofen 800 mg every 8 hours as needed for pain, fever.  Please take Ibuprofen with food.  Do not take more than 4000 mg of Tylenol (acetaminophen) in a 24 hour period.  

## 2021-04-24 NOTE — ED Provider Notes (Signed)
[facility] Emergency Department Provider Note  ____________________________________________   Event Date/Time   First MD Initiated Contact with Patient 04/24/21 0240     (approximate)  I have reviewed the triage vital signs and the nursing notes.   HISTORY  Chief Complaint Chest Pain    HPI Benjamin Mcneil is a 35 y.o. male with history of asthma who presents to the emergency department with left-sided chest pain, central epigastric pain and right upper quadrant abdominal pain that started at 1:10 AM and woke him from sleep.  Reports associated shortness of breath, nausea.  No fevers, cough, diaphoresis, dizziness, vomiting, diarrhea.  States symptoms worse with lying flat and better with sitting upright.  Symptoms waxing and waning.  Had a similar episode on March 10 and symptoms resolved spontaneously.  He was not seen by a doctor at that time.  No history of PE, DVT, exogenous estrogen use, recent fractures, surgery, trauma, hospitalization, prolonged travel or other immobilization. No lower extremity swelling or pain. No calf tenderness.  He denies history of hypertension, diabetes, hyperlipidemia, tobacco use, CAD.  No history of previous abdominal surgery.  States he ate chicken tenders for dinner.        Past Medical History:  Diagnosis Date  . Asthma     Patient Active Problem List   Diagnosis Date Noted  . 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 KNEE ARTHROSCOPY Left 09/23/2018   Procedure: ARTHROSCOPY KNEE CHONDROPLASTY AND CARTILAGE BIOPSY FOR MACI SURGERY;  Surgeon: Leim Fabry, MD;  Location: [place];  Service: Orthopedics;  Laterality: Left;  VERICEL RYAN  MACI BIOPSY KIT  . OSTEOCHONDRAL DEFECT REPAIR/RECONSTRUCTION Left 11/08/2018   Procedure: OSTEOCHONDRAL DEFECT REPAIR/RECONSTRUCTION, MACI IMPLANTATION TO MEDIAL CONDYLE, TROCHLEA tibial tuburcle osteotomy;   Surgeon: Leim Fabry, MD;  Location: [facility];  Service: Orthopedics;  Laterality: Left;  . WISDOM TOOTH EXTRACTION      Prior to Admission medications   Medication Sig Start Date End Date Taking? Authorizing Provider  dicyclomine (BENTYL) 20 MG tablet Take 1 tablet (20 mg total) by mouth every 8 (eight) hours as needed for spasms (Abdominal cramping). 04/24/21  Yes Romen Yutzy, Cyril Mourning N, DO  ondansetron (ZOFRAN ODT) 4 MG disintegrating tablet Take 1 tablet (4 mg total) by mouth every 6 (six) hours as needed for nausea or vomiting. 04/24/21  Yes Zeeva Courser, Delice Bison, DO  acetaminophen (TYLENOL) 325 MG tablet Take 650 mg by mouth every 6 (six) hours as needed for moderate pain or headache.    [provider]  albuterol (PROVENTIL HFA;VENTOLIN HFA) 108 (90 Base) MCG/ACT inhaler Inhale 2 puffs into the lungs every 6 (six) hours as needed for wheezing or shortness of breath.     [provider]  aspirin EC 325 MG EC tablet Take 1 tablet (325 mg total) by mouth daily. 11/09/18   Reche Dixon, PA-C  methocarbamol (ROBAXIN) 500 MG tablet Take 1 tablet (500 mg total) by mouth every 6 (six) hours as needed for muscle spasms. 11/09/18   Reche Dixon, PA-C  ondansetron (ZOFRAN) 4 MG tablet Take 1 tablet (4 mg total) by mouth every 6 (six) hours as needed for nausea. 11/09/18   Reche Dixon, PA-C  oxyCODONE (OXY IR/ROXICODONE) 5 MG immediate release tablet Take 1-2 tablets (5-10 mg total) by mouth every 4 (four) hours as needed for breakthrough pain. 11/09/18   Reche Dixon, PA-C    Allergies Patient has  no known allergies.  History reviewed. No pertinent family history.  Social History Social History   Tobacco Use  . Smoking status: Never Smoker  . Smokeless tobacco: Never Used  Vaping Use  . Vaping Use: Never used  Substance Use Topics  . Alcohol use: Yes    Comment: may drink 4-5 times/yr  . Drug use: Never    Review of Systems Constitutional: No fever. Eyes: No visual changes. ENT: No  sore throat. Cardiovascular: + chest pain. Respiratory:+ shortness of breath. Gastrointestinal: No vomiting, diarrhea. Genitourinary: Negative for dysuria. Musculoskeletal: Negative for back pain. Skin: Negative for rash. Neurological: Negative for focal weakness or numbness.  ____________________________________________   PHYSICAL EXAM:  VITAL SIGNS: ED Triage Vitals [04/24/21 0241]  Enc Vitals Group     BP (!) 153/94     Pulse Rate 72     Resp 16     Temp 97.8 F (36.6 C)     Temp Source Oral     SpO2 100 %     Weight 230 lb (104.3 kg)     Height 5' 9"  (1.753 m)     Head Circumference      Peak Flow      Pain Score 5     Pain Loc      Pain Edu?      Excl. in [place]?    CONSTITUTIONAL: Alert and oriented and responds appropriately to questions. Well-appearing; well-nourished HEAD: Normocephalic EYES: Conjunctivae clear, pupils appear equal, EOM appear intact ENT: normal nose; moist mucous membranes NECK: Supple, normal ROM CARD: RRR; S1 and S2 appreciated; no murmurs, no clicks, no rubs, no gallops RESP: Normal chest excursion without splinting or tachypnea; breath sounds clear and equal bilaterally; no wheezes, no rhonchi, no rales, no hypoxia or respiratory distress, speaking full sentences ABD/GI: Normal bowel sounds; non-distended; soft, tender to palpation in the right upper quadrant with negative Murphy sign, no tenderness at McBurney's point BACK: The back appears normal EXT: Normal ROM in all joints; no deformity noted, no edema; no cyanosis, no calf tenderness or calf swelling SKIN: Normal color for age and race; warm; no rash on exposed skin NEURO: Moves all extremities equally PSYCH: The patient's mood and manner are appropriate.  ____________________________________________   LABS (all labs ordered are listed, but only abnormal results are displayed)  Labs Reviewed  BASIC METABOLIC PANEL - Abnormal; Notable for the following components:      Result Value    Glucose, Bld 110 (*)    All other components within normal limits  CBC  HEPATIC FUNCTION PANEL  LIPASE, BLOOD  TROPONIN I (HIGH SENSITIVITY)   ____________________________________________  EKG   EKG Interpretation  Date/Time:  Wednesday April 24 2021 02:45:33 EDT Ventricular Rate:  66 PR Interval:  151 QRS Duration: 88 QT Interval:  384 QTC Calculation: 403 R Axis:   71 Text Interpretation: Sinus rhythm ST elev, probable normal early repol pattern Confirmed by Pryor Curia [id number]) on 04/24/2021 2:50:05 AM       ____________________________________________  RADIOLOGY Jessie Foot Cam Dauphin, personally viewed and evaluated these images (plain radiographs) as part of my medical decision making, as well as reviewing the written report by the radiologist.  ED MD interpretation: Chest x-ray clear.  Ultrasound shows cholelithiasis without cholecystitis.  Official radiology report(s): DG Chest 2 View  Result Date: 04/24/2021 CLINICAL DATA:  Mid chest pain radiating to the back. Shortness of breath. EXAM: CHEST - 2 VIEW COMPARISON:  None. FINDINGS: The heart size and mediastinal  contours are within normal limits. Both lungs are clear. The visualized skeletal structures are unremarkable. IMPRESSION: No active cardiopulmonary disease. Electronically Signed   By: Lucienne Capers M.D.   On: 04/24/2021 03:06   US Abdomen Limited RUQ (LIVER/GB)  Result Date: 04/24/2021 CLINICAL DATA:  Right upper quadrant abdominal pain EXAM: ULTRASOUND ABDOMEN LIMITED RIGHT UPPER QUADRANT COMPARISON:  None. FINDINGS: Gallbladder: A small amount of mobile echogenic nonshadowing debris is seen within the gallbladder fundus in keeping with tumefactive sludge. The gallbladder is mildly distended, however,, there is no gallbladder wall thickening, and no pericholecystic fluid is identified. The sonographic Percell Miller sign is reportedly negative. Common bile duct: Diameter: 3-4 mm in proximal diameter Liver: No focal  lesion identified. Within normal limits in parenchymal echogenicity. Multiple simple cysts are identified bilaterally measuring up to 9 mm. Portal vein is patent on color Doppler imaging with normal direction of blood flow towards the liver. Other: No ascites IMPRESSION: Cholelithiasis without sonographic evidence of acute cholecystitis. Electronically Signed   By: Fidela Salisbury MD   On: 04/24/2021 04:33    ____________________________________________   PROCEDURES  Procedure(s) performed (including Critical Care):  Procedures   ____________________________________________   INITIAL IMPRESSION / ASSESSMENT AND PLAN / ED COURSE  As part of my medical decision making, I reviewed the following data within the [place] History obtained from family, Nursing notes reviewed and incorporated, Labs reviewed , EKG interpreted , Radiograph reviewed  and Notes from prior ED visits         Patient here with atypical chest pain.  Low suspicion for ACS, PE or dissection.  He is PERC negative.  EKG is nonischemic.  Low suspicion for pericarditis, myocarditis but symptoms do change with position.  Given his upper abdominal pain on exam, I suspect this is more likely GERD, gastritis, esophageal spasm, symptomatic cholelithiasis, cholecystitis, pancreatitis.  No tenderness at McBurney's point to suggest appendicitis.  Will obtain cardiac labs as well as LFTs, lipase.  Will obtain chest x-ray and right upper quadrant abdominal ultrasound.  ED PROGRESS  Patient's work-up has shown normal labs with no leukocytosis, normal LFTs and lipase and a negative high-sensitivity troponin.  Chest x-ray is clear.  Ultrasound shows cholelithiasis without cholecystitis.  He reports feeling better after Toradol, Zofran here.  Able to tolerate p.o.  I feel he is safe to be discharged home.  I do not think clinically he has cholecystitis, cholangitis, choledocholithiasis today.  Have given outpatient surgery  follow-up information and will discharge with Bentyl, Zofran.  Recommended alternating Tylenol and Motrin over-the-counter.  Discussed low-fat diet and return precautions.  Patient and family at bedside are comfortable with this plan.  At this time, I do not feel there is any life-threatening condition present. I have reviewed, interpreted and discussed all results (EKG, imaging, lab, urine as appropriate) and exam findings with patient/family. I have reviewed nursing notes and appropriate previous records.  I feel the patient is safe to be discharged home without further emergent workup and can continue workup as an outpatient as needed. Discussed usual and customary return precautions. Patient/family verbalize understanding and are comfortable with this plan.  Outpatient follow-up has been provided as needed. All questions have been answered.  ____________________________________________   FINAL CLINICAL IMPRESSION(S) / ED DIAGNOSES  Final diagnoses:  RUQ abdominal pain  Symptomatic cholelithiasis     ED Discharge Orders         Ordered    dicyclomine (BENTYL) 20 MG tablet  Every 8 hours  PRN        04/24/21 0454    ondansetron (ZOFRAN ODT) 4 MG disintegrating tablet  Every 6 hours PRN        04/24/21 0454          *Please note:  Kamrin Sibley was evaluated in Emergency Department on 04/24/2021 for the symptoms described in the history of present illness. He was evaluated in the context of the global COVID-19 pandemic, which necessitated consideration that the patient might be at risk for infection with the SARS-CoV-2 virus that causes COVID-19. Institutional protocols and algorithms that pertain to the evaluation of patients at risk for COVID-19 are in a state of rapid change based on information released by regulatory bodies including the CDC and federal and state organizations. These policies and algorithms were followed during the patient's care in the ED.  Some ED evaluations and  interventions may be delayed as a result of limited staffing during and the pandemic.*   Note:  This document was prepared using Dragon voice recognition software and may include unintentional dictation errors.   Dequandre Cordova, Delice Bison, DO 04/24/21 [phone]

## 2021-04-24 NOTE — ED Notes (Signed)
Report off to kendall rn  

## 2021-04-24 NOTE — ED Triage Notes (Signed)
Pt to ED from home c/o mid chest pain tonight radiating to back like pressure, feeling SOB.  Denies n/v/d, denies drug or alcohol use or cardiac hx.  States pain woke him up from sleep.  Pt A&Ox4, chest rise even and unlabored, skin WNL, in NAD at this time.

## 2021-04-24 NOTE — ED Notes (Signed)
Pt reports waking up with chest pain in center of chest tonight.   Pt took tums and gasx without relief.  No n/v/d   No diaphoresis.   nsr on monitor.   Pt alert  Speech clear.  Family with pt.

## 2021-12-29 HISTORY — PX: CHOLECYSTECTOMY, LAPAROSCOPIC: SHX56

## 2022-03-31 IMAGING — US US ABDOMEN LIMITED RUQ/ASCITES
1 series · 14 of 25 positions shown · non-contrast
Comparison: None.

CLINICAL DATA: Right upper quadrant abdominal pain

EXAM:
ULTRASOUND ABDOMEN LIMITED RIGHT UPPER QUADRANT

[Series 1: us abdomen limited ruq (liver/gb) · 14 of 52 slices shown]
[im 1/52]
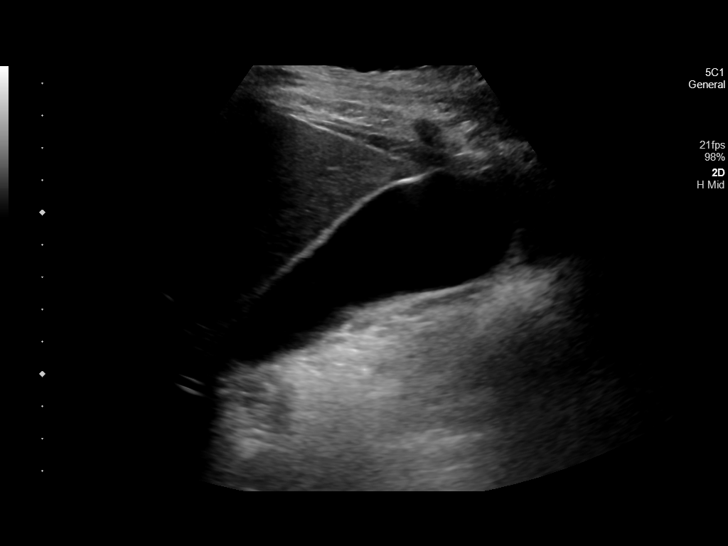
[im 5/52]
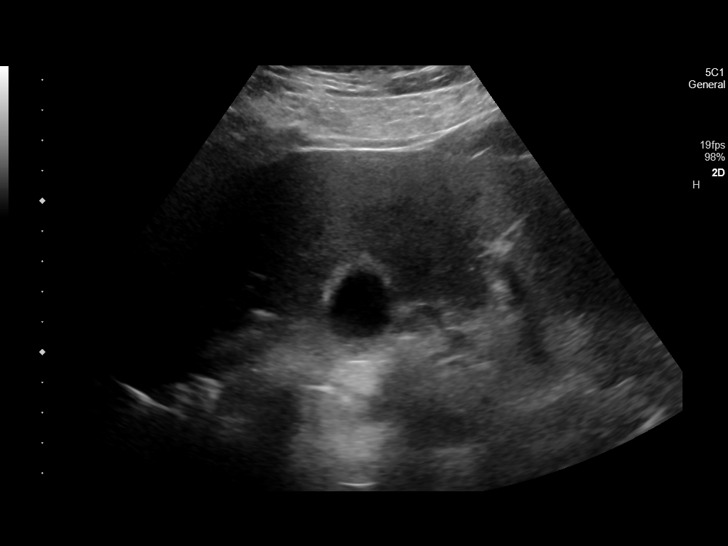
[im 9/52]
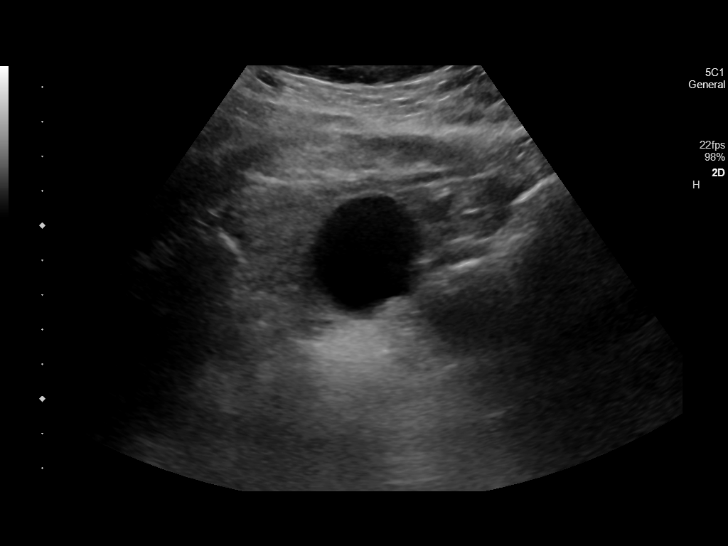
[im 13/52]
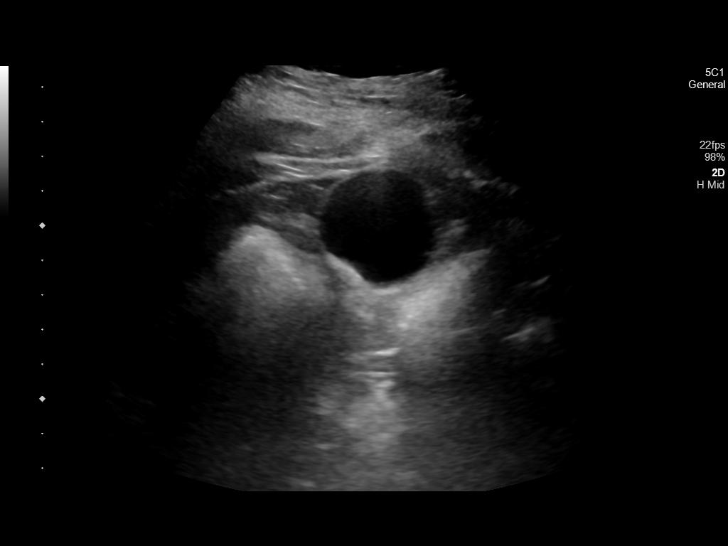
[im 18/52]
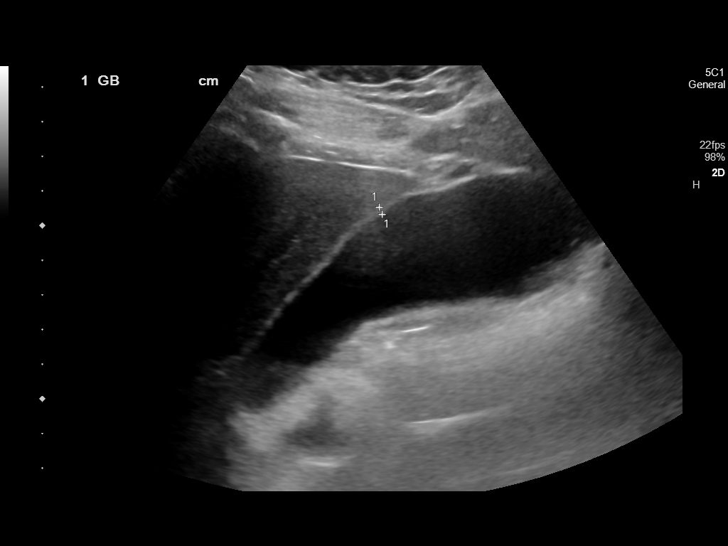
[im 20/52]
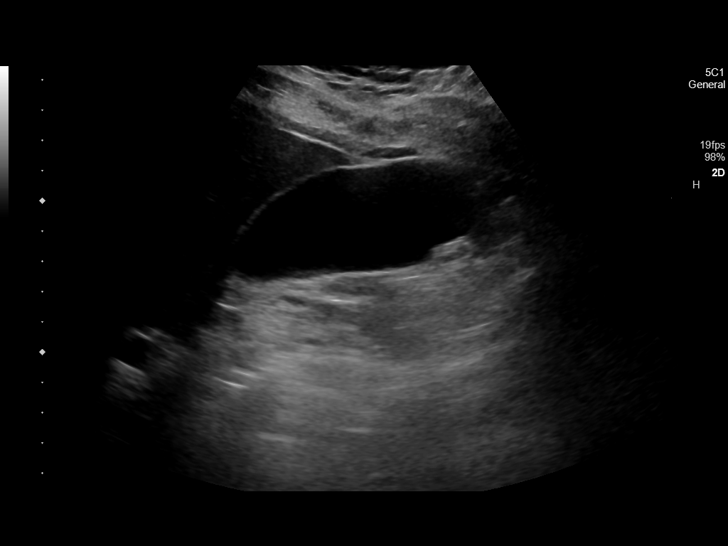
[im 24/52]
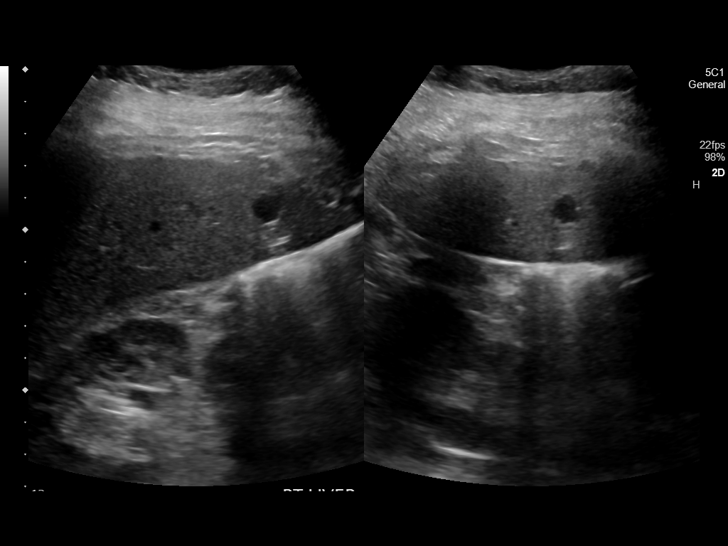
[im 28/52]
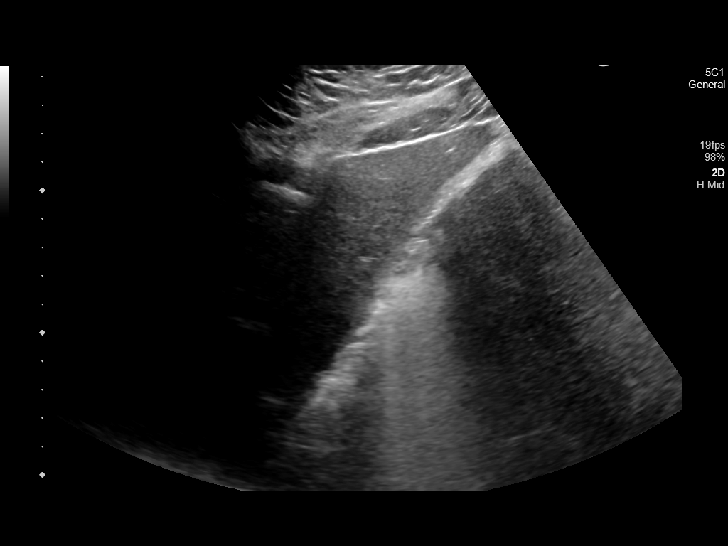
[im 32/52]
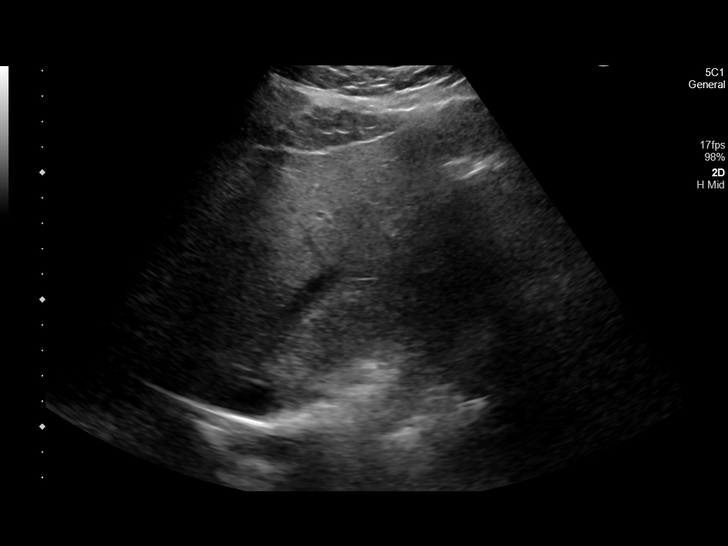
[im 35/52]
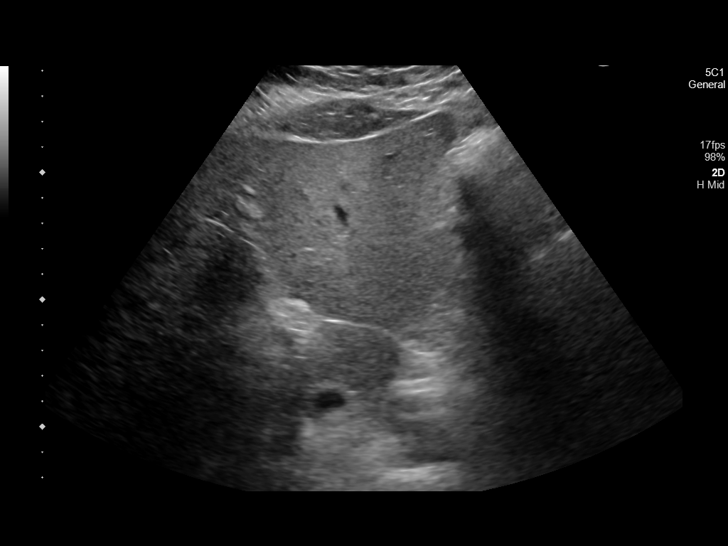
[im 39/52]
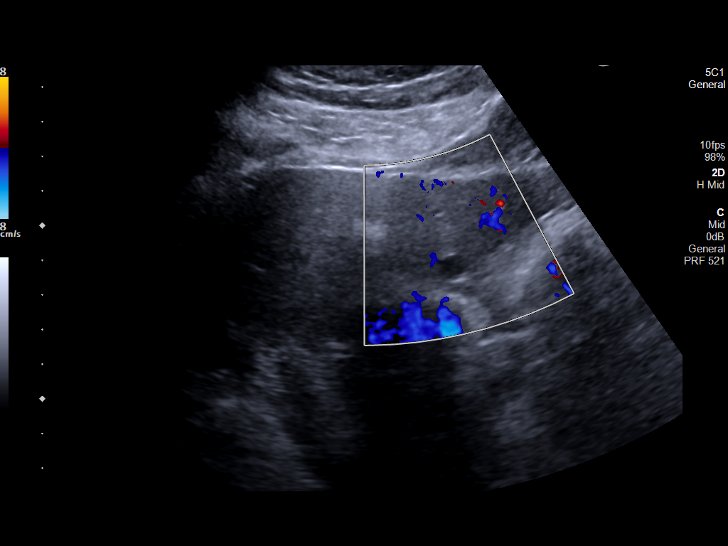
[im 43/52]
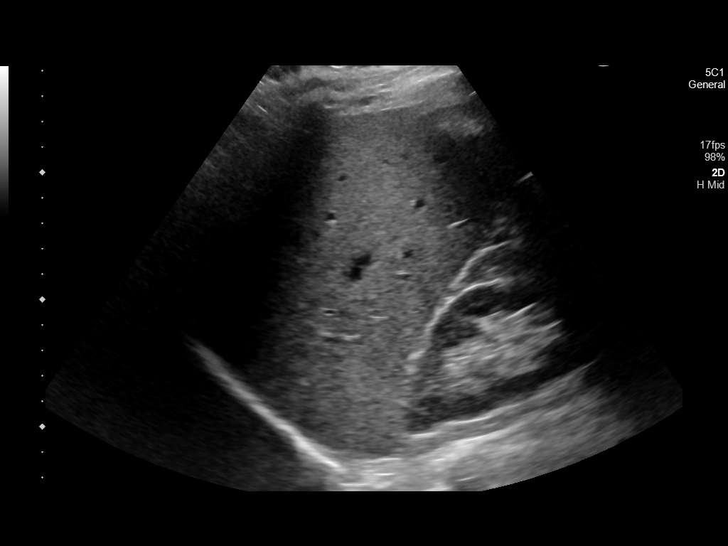
[im 47/52]
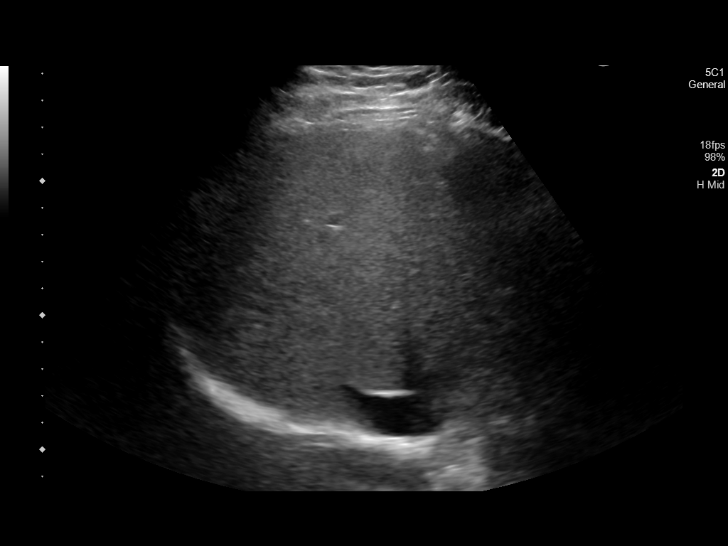
[im 52/52]
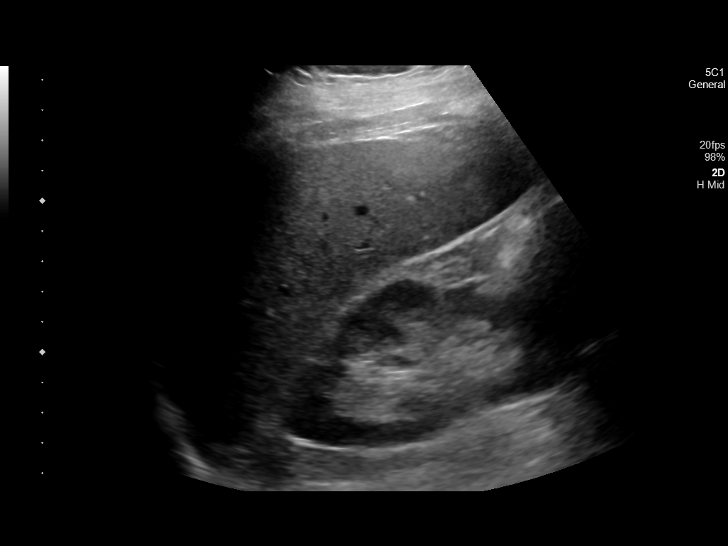

[14 of 25 positions shown; findings below may reference images not displayed]

FINDINGS: Gallbladder:

A small amount of mobile echogenic nonshadowing debris is seen
within the gallbladder fundus in keeping with tumefactive sludge.
The gallbladder is mildly distended, however,, there is no
gallbladder wall thickening, and no pericholecystic fluid is
identified. The sonographic Murphy sign is reportedly negative.

Common bile duct:

Diameter: 3-4 mm in proximal diameter

Liver:

No focal lesion identified. Within normal limits in parenchymal
echogenicity. Multiple simple cysts are identified bilaterally
measuring up to 9 mm. Portal vein is patent on color Doppler imaging
with normal direction of blood flow towards the liver.

Other: No ascites
IMPRESSION: Cholelithiasis without sonographic evidence of acute cholecystitis.

## 2023-10-21 ENCOUNTER — Ambulatory Visit: Payer: BC Managed Care – PPO | Admitting: Orthopaedic Surgery

## 2023-10-21 ENCOUNTER — Other Ambulatory Visit (INDEPENDENT_AMBULATORY_CARE_PROVIDER_SITE_OTHER): Payer: Self-pay

## 2023-10-21 ENCOUNTER — Other Ambulatory Visit: Payer: Self-pay

## 2023-10-21 DIAGNOSIS — G8929 Other chronic pain: Secondary | ICD-10-CM

## 2023-10-21 DIAGNOSIS — M25562 Pain in left knee: Secondary | ICD-10-CM

## 2023-10-21 NOTE — Progress Notes (Signed)
The patient is an active 37 year old gentleman who is 6 years out from a Maci procedure on his left knee that was performed in [place] by Dr. Allena Katz.  He had a significant OCD lesion underneath the patella and had some realignment type of procedure as well.  He is an avid Environmental manager.  Over the last year or more he has had intermittent swelling of his left knee with sports related activities and other activities.  He has had numerous steroid injections as well as several months worth of physical therapy to his knee.  He is here today for another opinion as a relates to his continued knee swelling, pain and discomfort.  A lot of his pain is around the patella tendon.  He gets a lot of clicking sensations in the knee as well.  He is otherwise an active 37 year old gentleman.  I was able to review all of his medications and past medical history within epic.  His mother is well-known to [place].  She sent him our way for another opinion.  Examination of his left knee does show a mild effusion comparing left and right knees.  There is a well-healed midline surgical incision.  The patella seems to track well but there is a lot of pain along the patella tendon and just at the medial joint line as well.  X-rays of the left knee show intact hardware from 2 screws going from anterior to posterior at the proximal tibia.  There is arthritic changes around the patellofemoral joint as well.  At this point a MRI of his left knee is warranted to assess the patella tendon and look for inflammation around the patella as well as assess the cartilage given his continued swelling, pain and the failure conservative treatment for over a year including steroid injections, activity modification and formal physical therapy.  We will see him back once we have this MRI.  He agrees with the treatment plan.

## 2023-10-22 ENCOUNTER — Encounter: Payer: Self-pay | Admitting: Orthopaedic Surgery

## 2023-10-27 ENCOUNTER — Ambulatory Visit
Admission: RE | Admit: 2023-10-27 | Discharge: 2023-10-27 | Disposition: A | Discharge status: Home / Self Care | Payer: BC Managed Care – PPO | Source: Ambulatory Visit | Attending: Orthopaedic Surgery | Admitting: Orthopaedic Surgery

## 2023-10-27 DIAGNOSIS — G8929 Other chronic pain: Secondary | ICD-10-CM

## 2023-11-18 ENCOUNTER — Other Ambulatory Visit: Payer: Self-pay | Admitting: Radiology

## 2023-11-18 ENCOUNTER — Ambulatory Visit: Payer: BC Managed Care – PPO | Admitting: Orthopaedic Surgery

## 2023-11-18 ENCOUNTER — Encounter: Payer: Self-pay | Admitting: Orthopaedic Surgery

## 2023-11-18 DIAGNOSIS — M1712 Unilateral primary osteoarthritis, left knee: Secondary | ICD-10-CM

## 2023-11-18 DIAGNOSIS — G8929 Other chronic pain: Secondary | ICD-10-CM | POA: Diagnosis not present

## 2023-11-18 DIAGNOSIS — M25562 Pain in left knee: Secondary | ICD-10-CM

## 2023-11-18 MED ORDER — LIDOCAINE HCL 1 % IJ SOLN
3.0000 mL | INTRAMUSCULAR | Status: AC | PRN
  Administered 2023-11-18: 3 mL

## 2023-11-18 MED ORDER — METHYLPREDNISOLONE ACETATE 40 MG/ML IJ SUSP
40.0000 mg | INTRAMUSCULAR | Status: AC | PRN
  Administered 2023-11-18: 40 mg via INTRA_ARTICULAR

## 2023-11-18 NOTE — Progress Notes (Signed)
The patient is a 37 year old gentleman who comes in for follow-up after we obtained a MRI of his left knee to assess the cartilage and ligaments.  He has a remote history of a Maci procedure done on that knee about 6 years ago.  He still has significant medial knee pain but no instability symptoms.  He is athletic and active.  He has had steroid injections in the past but no hyaluronic acid for his left knee.  He has not had any type of other injections such as PRP.  However, it was essential to obtain a MRI of his left knee at this point given several years of failure of conservative treatment.  He has worked on Dance movement psychotherapist exercises as well.  He does not walk with a limp.  On exam his knee does have a mild effusion with his left knee.  There is a well-healed midline surgical incision.  The knee has full range of motion with medial joint line tenderness.  The knee is ligamentously stable.  His extension is full and neutral.   The MRI of his left knee does show arthritic changes in all 3 compartments.  The medial sides of the small area of full-thickness cartilage fissuring along the weightbearing surface with associated subchondral bone marrow edema.  There is only some partial loss of the central trochlea and thinning of the cartilage laterally.  The medial lateral meniscus are intact.  His ACL and PCL are intact.  At this point we had a long and thorough discussion about his knee.  He did request a steroid injection in his knee today to just calm down some of the acute pain he has been having recently.  I agree with this and he tolerated a left knee steroid injection well.  I would like to send him to one of my colleagues in town for an opinion as a relates to this patient being a candidate for a partial medial compartment knee replacement.  Given the fact that his symptoms are only medial and he is young at 37 years old combined with the fact that he has intact ligaments and his extension is full,  he may be the perfect candidate for a medial compartment partial knee replacement.  I would like to send him to Dr. Teryl Lucy with [facility] for his opinion.  The patient agrees with this treatment plan.    Procedure Note  Patient: Benjamin Mcneil             Date of Birth: [birth year]           MRN: [id number]             Visit Date: 11/18/2023  Procedures: Visit Diagnoses:  1. Chronic pain of left knee   2. Unilateral primary osteoarthritis, left knee     Large Joint Inj: L knee on 11/18/2023 9:12 AM Indications: diagnostic evaluation and pain Details: 22 G 1.5 in needle, superolateral approach  Arthrogram: No  Medications: 3 mL lidocaine 1 %; 40 mg methylPREDNISolone acetate 40 MG/ML Outcome: tolerated well, no immediate complications Procedure, treatment alternatives, risks and benefits explained, specific risks discussed. Consent was given by the patient. Immediately prior to procedure a time out was called to verify the correct patient, procedure, equipment, support staff and site/side marked as required. Patient was prepped and draped in the usual sterile fashion.

## 2024-09-24 ENCOUNTER — Ambulatory Visit: Admission: EM | Admit: 2024-09-24 | Discharge: 2024-09-24 | Disposition: A | Discharge status: Home / Self Care

## 2024-09-24 ENCOUNTER — Encounter: Payer: Self-pay | Admitting: Emergency Medicine

## 2024-09-24 DIAGNOSIS — N489 Disorder of penis, unspecified: Secondary | ICD-10-CM

## 2024-09-24 NOTE — ED Triage Notes (Signed)
 Patient reports red spot on the head of penis. Patient also states his wife said she seen a blood pustule under his left testicle. Denies patient has no taken anything for symptoms.

## 2024-09-24 NOTE — Discharge Instructions (Signed)
 Area to the head of the penis is most consistent with skin irritation  Does not appear to be a herpetic lesion which appears to be more blistering, can have clear to yellow drainage, itching and pain and typically appears as a cluster of bumps  Is not consistent with yeast which will cause Velencia Lenart drainage along with irritation along the head and shaft of the penis  Bump to the testicle is consistent with a hair bump, may apply warm compresses to the area and exfoliate with a rough textured washcloth, using a circular motion to help the hair to expel from the skin, you may see drainage from this area this is common  You may apply topical hydrocortisone over the affected area to help reduce inflammation  You may also leave the area alone and monitor as this typically will improve with time  If you notice any changes such as worsening redness, the site becoming painful or itching, you begin to notice puslike drainage this would be concerning and you need to follow-up for reevaluation

## 2024-09-24 NOTE — ED Provider Notes (Signed)
 Benjamin Mcneil    CSN: [id number] Arrival date & time: 09/24/24  0801      History   Chief Complaint Chief Complaint  Patient presents with   Mass    HPI Benjamin Mcneil is a 38 y.o. male.   Patient presents for evaluation of a red spot noticed to the penis this morning upon awakening.  Endorses that his wife has noticed a blood blister to the left testicle.  Denies pruritus, drainage, pain.  Has not occurred prior.  Endorses that his boxers were slightly tight yesterday.  Denies injury or trauma to the penis.  Denies penile drainage or testicle swelling.  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    Past Surgical History:  Procedure Laterality Date   KNEE ARTHROSCOPY Left 09/23/2018   Procedure: ARTHROSCOPY KNEE CHONDROPLASTY AND CARTILAGE BIOPSY FOR MACI SURGERY;  Surgeon: Tobie Priest, MD;  Location: [facility];  Service: Orthopedics;  Laterality: Left;  VERICEL RYAN  MACI BIOPSY KIT   OSTEOCHONDRAL DEFECT REPAIR/RECONSTRUCTION Left 11/08/2018   Procedure: OSTEOCHONDRAL DEFECT REPAIR/RECONSTRUCTION, MACI IMPLANTATION TO MEDIAL CONDYLE, TROCHLEA tibial tuburcle osteotomy;  Surgeon: Tobie Priest, MD;  Location: [facility];  Service: Orthopedics;  Laterality: Left;   WISDOM TOOTH EXTRACTION         Home Medications    Prior to Admission medications   Medication Sig Start Date End Date Taking? Authorizing Provider  acetaminophen  (TYLENOL ) 325 MG tablet Take 650 mg by mouth every 6 (six) hours as needed for moderate pain or headache.    [provider]  albuterol  (PROVENTIL  HFA;VENTOLIN  HFA) 108 (90 Base) MCG/ACT inhaler Inhale 2 puffs into the lungs every 6 (six) hours as needed for wheezing or shortness of breath.     [provider]  aspirin  EC 325 MG EC tablet Take 1 tablet (325 mg total) by mouth daily. 11/09/18   Verlinda Boas, PA-C  dicyclomine  (BENTYL ) 20 MG tablet Take 1 tablet (20 mg total) by mouth every 8 (eight) hours as needed for spasms (Abdominal cramping). 04/24/21   Ward, Josette SAILOR, DO  methocarbamol  (ROBAXIN ) 500 MG tablet Take 1 tablet (500 mg total) by mouth every 6 (six) hours as needed for muscle spasms. 11/09/18   Verlinda Boas, PA-C  ondansetron  (ZOFRAN  ODT) 4 MG disintegrating tablet Take 1 tablet (4 mg total) by mouth every 6 (six) hours as needed for nausea or vomiting. 04/24/21   Ward, Josette SAILOR, DO  ondansetron  (ZOFRAN ) 4 MG tablet Take 1 tablet (4 mg total) by mouth every 6 (six) hours as needed for nausea. 11/09/18   Verlinda Boas, PA-C  oxyCODONE  (OXY IR/ROXICODONE ) 5 MG immediate release tablet Take 1-2 tablets (5-10 mg total) by mouth every 4 (four) hours as needed for breakthrough pain. 11/09/18   Verlinda Boas, PA-C    Family History History reviewed. No pertinent family history.  Social History Social History   Tobacco Use   Smoking status: Never   Smokeless tobacco: Never  Vaping Use   Vaping status: Never Used  Substance Use Topics   Alcohol use: Yes    Comment: may drink 4-5 times/yr   Drug use: Never     Allergies   Patient has no known allergies.   Review of Systems Review of Systems   Physical Exam Triage Vital Signs ED Triage Vitals  Encounter Vitals Group  BP 09/24/24 0813 138/89     Girls Systolic BP Percentile --      Girls Diastolic BP Percentile --      Boys Systolic BP Percentile --      Boys Diastolic BP Percentile --      Pulse Rate 09/24/24 0813 80     Resp 09/24/24 0813 18     Temp 09/24/24 0813 98.6 F (37 C)     Temp Source 09/24/24 0813 Oral     SpO2 09/24/24 0813 99 %     Weight --      Height --      Head Circumference --      Peak Flow --      Pain Score 09/24/24 0817 0     Pain Loc --      Pain Education --      Exclude from Growth Chart --    No data found.  Updated Vital Signs BP 138/89 (BP Location: Left Arm)   Pulse 80    Temp 98.6 F (37 C) (Oral)   Resp 18   SpO2 99%   Visual Acuity Right Eye Distance:   Left Eye Distance:   Bilateral Distance:    Right Eye Near:   Left Eye Near:    Bilateral Near:     Physical Exam Constitutional:      Appearance: Normal appearance.  Eyes:     Extraocular Movements: Extraocular movements intact.  Pulmonary:     Effort: Pulmonary effort is normal.  Genitourinary:      Comments: Erythematous macule present to the left side of the penile head, no tenderness, no drainage  0.5 cm papule present to the left testicle, erythematous with a [facility] [place] Neurological:     Mental Status: He is alert and oriented to person, place, and time. Mental status is at baseline.      UC Treatments / Results  Labs (all labs ordered are listed, but only abnormal results are displayed) Labs Reviewed - No data to display  EKG   Radiology No results found.  Procedures Procedures (including critical care time)  Medications Ordered in UC Medications - No data to display  Initial Impression / Assessment and Plan / UC Course  I have reviewed the triage vital signs and the nursing notes.  Pertinent labs & imaging results that were available during my care of the patient were reviewed by me and considered in my medical decision making (see chart for details).  Penile lesion  Erythematous macule most consistent with skin irritation most likely due to friction, discussed this with patient, not consistent with herpetic lesion therefore culturing deferred, discussed, presentation of the lesion to the testicle most consistent with hairball, discussed nonpharmacological measures, advised him to monitor lesions and to follow-up for any changes Final Clinical Impressions(s) / UC Diagnoses   Final diagnoses:  Penile lesion     Discharge Instructions      Area to the head of the penis is most consistent with skin irritation  Does not appear to be a herpetic lesion  which appears to be more blistering, can have clear to yellow drainage, itching and pain and typically appears as a cluster of bumps  Is not consistent with yeast which will cause Kimya Mccahill drainage along with irritation along the head and shaft of the penis  Bump to the testicle is consistent with a hair bump, may apply warm compresses to the area and exfoliate with a rough textured washcloth, using a circular  motion to help the hair to expel from the skin, you may see drainage from this area this is common  You may apply topical hydrocortisone over the affected area to help reduce inflammation  You may also leave the area alone and monitor as this typically will improve with time  If you notice any changes such as worsening redness, the site becoming painful or itching, you begin to notice puslike drainage this would be concerning and you need to follow-up for reevaluation   ED Prescriptions   None    PDMP not reviewed this encounter.   Teresa Shelba SAUNDERS, [place] 09/24/24 [phone]

## 2024-10-31 ENCOUNTER — Encounter: Payer: Self-pay | Admitting: Radiology

## 2024-12-09 ENCOUNTER — Encounter: Payer: Self-pay | Admitting: Family Medicine

## 2024-12-09 ENCOUNTER — Telehealth: Payer: Self-pay

## 2024-12-09 ENCOUNTER — Ambulatory Visit: Admitting: Family Medicine

## 2024-12-09 ENCOUNTER — Other Ambulatory Visit (HOSPITAL_COMMUNITY)
Admission: RE | Admit: 2024-12-09 | Discharge: 2024-12-09 | Disposition: A | Source: Ambulatory Visit | Attending: Family Medicine | Admitting: Family Medicine

## 2024-12-09 ENCOUNTER — Other Ambulatory Visit
Admission: RE | Admit: 2024-12-09 | Discharge: 2024-12-09 | Disposition: A | Attending: Family Medicine | Admitting: Family Medicine

## 2024-12-09 VITALS — BP 128/74 | HR 74 | Resp 16 | Ht 69.0 in | Wt 229.0 lb

## 2024-12-09 DIAGNOSIS — M542 Cervicalgia: Secondary | ICD-10-CM | POA: Diagnosis not present

## 2024-12-09 DIAGNOSIS — Z800 Family history of malignant neoplasm of digestive organs: Secondary | ICD-10-CM

## 2024-12-09 DIAGNOSIS — Z6833 Body mass index (BMI) 33.0-33.9, adult: Secondary | ICD-10-CM

## 2024-12-09 DIAGNOSIS — Z833 Family history of diabetes mellitus: Secondary | ICD-10-CM | POA: Diagnosis not present

## 2024-12-09 DIAGNOSIS — J45909 Unspecified asthma, uncomplicated: Secondary | ICD-10-CM | POA: Diagnosis not present

## 2024-12-09 DIAGNOSIS — Z13220 Encounter for screening for lipoid disorders: Secondary | ICD-10-CM

## 2024-12-09 DIAGNOSIS — R21 Rash and other nonspecific skin eruption: Secondary | ICD-10-CM

## 2024-12-09 DIAGNOSIS — R6882 Decreased libido: Secondary | ICD-10-CM

## 2024-12-09 DIAGNOSIS — E66811 Obesity, class 1: Secondary | ICD-10-CM

## 2024-12-09 DIAGNOSIS — Z0000 Encounter for general adult medical examination without abnormal findings: Secondary | ICD-10-CM

## 2024-12-09 DIAGNOSIS — R42 Dizziness and giddiness: Secondary | ICD-10-CM

## 2024-12-09 DIAGNOSIS — E669 Obesity, unspecified: Secondary | ICD-10-CM

## 2024-12-09 DIAGNOSIS — N4829 Other inflammatory disorders of penis: Secondary | ICD-10-CM | POA: Insufficient documentation

## 2024-12-09 DIAGNOSIS — Z113 Encounter for screening for infections with a predominantly sexual mode of transmission: Secondary | ICD-10-CM

## 2024-12-09 LAB — HIV ANTIBODY (ROUTINE TESTING W REFLEX): HIV Screen 4th Generation wRfx: NONREACTIVE

## 2024-12-09 MED ORDER — CELECOXIB 100 MG PO CAPS: 100.0000 mg | ORAL_CAPSULE | Freq: Two times a day (BID) | ORAL | 0 refills | Status: AC | PRN

## 2024-12-09 MED ORDER — TIZANIDINE HCL 4 MG PO TABS: 4.0000 mg | ORAL_TABLET | Freq: Every evening | ORAL | 0 refills | Status: AC | PRN

## 2024-12-09 NOTE — Telephone Encounter (Signed)
 Called and clarified

## 2024-12-09 NOTE — Telephone Encounter (Signed)
 Copied from CRM #[id number]. Topic: Clinical - Lab/Test Results >> Dec 09, 2024 12:02 PM Delon HERO wrote: Reason for CRM: Jaycee calling from [facility] lab is calling to report that the patient came for labs. He is reporting some where collected and some where not. Jaycee would like to know what labs need to be collected for the patient? Please advise CB- [phone]

## 2024-12-09 NOTE — Progress Notes (Addendum)
 New Patient Office Visit  Subjective    Patient ID: Benjamin Mcneil, male    DOB: 06-30-1986  Age: 38 y.o. MRN: [id number]  CC:  Chief Complaint  Patient presents with   Establish Care    HPI Therman Hughlett presents to establish care. He is transferring from PCP with [facility]. He voices overall he has been well. He was previously following with ortho for left knee OA, however last seen last year. 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   Family hx of colon cancer. He has been getting colonoscopies since age 42 and typically goes every 5 years unless there are abnormal findings such as a polyp he will be seen the following year for one year follow up. He states he is due for his colonoscopy this year, and his last colonoscopy was 2020. He follows with Dr. Aundria. Will attempt to obtain records.   He does endorse episodes of shortness of breath which he associates with asthma flares, however has not required rescue inhaler use in the last one month. He states he does have an Albuterol  inhaler at home.   He does report dizziness, however voices this was one episode yesterday. He voices he was working on the computer and does not know if dizziness was due to trying to read tiny text. He voices he took a 30 minute nap and this improved symptoms, however when returning to doing work he felt dizzy again. He admits he has not had a recent eye exam. He does wear glasses, not contact lenses.  He voices concerns about a rash on his penis. He reports approximately one month ago he saw a red spot on his penis after showering. He voices he went to urgent care for evaluation. He denies pain associated. He denies associated pruritus. Denies penile discharge. He voices he was told that it was a rash and was not given anything for treatment. He is sexually active, however he is married and is sexually active with the one  partner. He states 3 days ago the rash did peel and then returned to discoloration.   He also complains of neck pain starting several months ago. He describes pain as a crick in his neck.    Outpatient Encounter Medications as of 12/09/2024  Medication Sig   celecoxib (CELEBREX) 100 MG capsule Take 1 capsule (100 mg total) by mouth 2 (two) times daily as needed (neck pain).   tiZANidine (ZANAFLEX) 4 MG tablet Take 1 tablet (4 mg total) by mouth at bedtime as needed (neck pain).   Turmeric (QC TUMERIC COMPLEX PO) Take 1 capsule by mouth daily.   [DISCONTINUED] acetaminophen  (TYLENOL ) 325 MG tablet Take 650 mg by mouth every 6 (six) hours as needed for moderate pain or headache.   [DISCONTINUED] albuterol  (PROVENTIL  HFA;VENTOLIN  HFA) 108 (90 Base) MCG/ACT inhaler Inhale 2 puffs into the lungs every 6 (six) hours as needed for wheezing or shortness of breath.    [DISCONTINUED] aspirin  EC 325 MG EC tablet Take 1 tablet (325 mg total) by mouth daily.   [DISCONTINUED] dicyclomine  (BENTYL ) 20 MG tablet Take 1 tablet (20 mg total) by mouth every 8 (eight) hours as needed for spasms (Abdominal cramping).   [DISCONTINUED] methocarbamol  (ROBAXIN ) 500 MG tablet Take 1 tablet (500 mg total) by mouth every 6 (six) hours as needed for muscle spasms.   [DISCONTINUED] ondansetron  (ZOFRAN  ODT) 4 MG disintegrating  tablet Take 1 tablet (4 mg total) by mouth every 6 (six) hours as needed for nausea or vomiting.   [DISCONTINUED] ondansetron  (ZOFRAN ) 4 MG tablet Take 1 tablet (4 mg total) by mouth every 6 (six) hours as needed for nausea.   [DISCONTINUED] oxyCODONE  (OXY IR/ROXICODONE ) 5 MG immediate release tablet Take 1-2 tablets (5-10 mg total) by mouth every 4 (four) hours as needed for breakthrough pain.   No facility-administered encounter medications on file as of 12/09/2024.    Past Medical History:  Diagnosis Date   Asthma    Colon polyp     Past Surgical History:  Procedure Laterality Date    CHOLECYSTECTOMY, LAPAROSCOPIC  2023   KNEE ARTHROSCOPY Left 09/23/2018   Procedure: ARTHROSCOPY KNEE CHONDROPLASTY AND CARTILAGE BIOPSY FOR MACI SURGERY;  Surgeon: Tobie Priest, MD;  Location: [facility];  Service: Orthopedics;  Laterality: Left;  VERICEL RYAN  MACI BIOPSY KIT   OSTEOCHONDRAL DEFECT REPAIR/RECONSTRUCTION Left 11/08/2018   Procedure: OSTEOCHONDRAL DEFECT REPAIR/RECONSTRUCTION, MACI IMPLANTATION TO MEDIAL CONDYLE, TROCHLEA tibial tuburcle osteotomy;  Surgeon: Tobie Priest, MD;  Location: [facility];  Service: Orthopedics;  Laterality: Left;   WISDOM TOOTH EXTRACTION      Family History  Problem Relation Age of Onset   Colon cancer Father    Heart Problems Brother    Colon polyps Maternal Grandmother    Colon polyps Maternal Grandfather     Social History   Socioeconomic History   Marital status: Married    Spouse name: Not on file   Number of children: Not on file   Years of education: Not on file   Highest education level: Not on file  Occupational History   Not on file  Tobacco Use   Smoking status: Never   Smokeless tobacco: Never  Vaping Use   Vaping status: Never Used  Substance and Sexual Activity   Alcohol use: Yes    Comment: may drink 4-5 times/yr   Drug use: Never   Sexual activity: Yes  Other Topics Concern   Not on file  Social History Narrative   Not on file   Social Drivers of Health   Tobacco Use: Low Risk (12/09/2024)   Patient History    Smoking Tobacco Use: Never    Smokeless Tobacco Use: Never    Passive Exposure: Not on file  Financial Resource Strain: Low Risk (12/09/2024)   Overall Financial Resource Strain (CARDIA)    Difficulty of Paying Living Expenses: Not hard at all  Food Insecurity: No Food Insecurity (12/09/2024)   Epic    Worried About Radiation Protection Practitioner of Food in the Last Year: Never true    Ran Out of Food in the Last Year: Never true  Transportation Needs: No Transportation Needs (12/09/2024)   Epic    Lack of  Transportation (Medical): No    Lack of Transportation (Non-Medical): No  Physical Activity: Inactive (12/09/2024)   Exercise Vital Sign    Days of Exercise per Week: 0 days    Minutes of Exercise per Session: 0 min  Stress: No Stress Concern Present (12/09/2024)   [employer] of Occupational Health - Occupational Stress Questionnaire    Feeling of Stress: Not at all  Social Connections: Moderately Integrated (12/09/2024)   Social Connection and Isolation Panel    Frequency of Communication with Friends and Family: More than three times a week    Frequency of Social Gatherings with Friends and Family: More than three times a week    Attends Religious Services:  Never    Active Member of Clubs or Organizations: Yes    Attends Club or Organization Meetings: More than 4 times per year    Marital Status: Married  Intimate Partner Violence: Not At Risk (12/09/2024)   Epic    Fear of Current or Ex-Partner: No    Emotionally Abused: No    Physically Abused: No    Sexually Abused: No  Depression (PHQ2-9): Low Risk (12/09/2024)   Depression (PHQ2-9)    PHQ-2 Score: 0  Alcohol Screen: Low Risk (12/09/2024)   Alcohol Screen    Last Alcohol Screening Score (AUDIT): 1  Housing: Low Risk (12/09/2024)   Epic    Unable to Pay for Housing in the Last Year: No    Number of Times Moved in the Last Year: 0    Homeless in the Last Year: No  Utilities: Not At Risk (12/09/2024)   Epic    Threatened with loss of utilities: No  Health Literacy: Adequate Health Literacy (12/09/2024)   B1300 Health Literacy    Frequency of need for help with medical instructions: Never    Review of Systems  Constitutional:  Negative for chills, fever, malaise/fatigue and weight loss.  HENT:  Negative for congestion, ear pain, sinus pain, sore throat and tinnitus.   Eyes:  Negative for blurred vision and double vision.  Respiratory:  Negative for cough and shortness of breath.   Cardiovascular:  Negative for  chest pain, palpitations and leg swelling.  Gastrointestinal:  Negative for abdominal pain, blood in stool, constipation, diarrhea, heartburn, nausea and vomiting.  Genitourinary:  Negative for dysuria, frequency, hematuria and urgency.  Musculoskeletal:  Positive for joint pain. Negative for falls.  Skin:  Positive for rash.  Neurological:  Positive for dizziness. Negative for tingling, tremors, loss of consciousness and headaches.  Endo/Heme/Allergies:  Does not bruise/bleed easily.  Psychiatric/Behavioral:  Negative for depression and suicidal ideas. The patient is not nervous/anxious and does not have insomnia.         Objective    BP 128/74   Pulse 74   Resp 16   Ht 5' 9 (1.753 m)   Wt 229 lb (103.9 kg)   SpO2 99%   BMI 33.82 kg/m   Physical Exam Exam conducted with a chaperone present.  Constitutional:      General: He is not in acute distress.    Appearance: Normal appearance. He is obese.  HENT:     Head: Normocephalic and atraumatic.     Right Ear: Tympanic membrane normal.     Left Ear: Tympanic membrane normal.     Mouth/Throat:     Mouth: Mucous membranes are moist.     Pharynx: Oropharynx is clear.  Eyes:     Conjunctiva/sclera: Conjunctivae normal.     Pupils: Pupils are equal, round, and reactive to light.  Neck:     Vascular: No carotid bruit.  Cardiovascular:     Rate and Rhythm: Normal rate and regular rhythm.     Pulses: Normal pulses.     Heart sounds: Normal heart sounds.  Pulmonary:     Effort: Pulmonary effort is normal. No respiratory distress.     Breath sounds: Normal breath sounds. No wheezing, rhonchi or rales.  Abdominal:     General: There is no distension.     Palpations: Abdomen is soft.     Tenderness: There is no abdominal tenderness.  Genitourinary:    Penis: No tenderness, discharge or swelling.   Musculoskeletal:  Cervical back: Normal range of motion and neck supple. No tenderness.     Right lower leg: No edema.      Left lower leg: No edema.  Lymphadenopathy:     Cervical: No cervical adenopathy.  Skin:    General: Skin is warm and dry.     Findings: Rash present.  Neurological:     General: No focal deficit present.     Mental Status: He is alert and oriented to person, place, and time.  Psychiatric:        Mood and Affect: Mood normal.        Behavior: Behavior normal.      Assessment and Plan:  1. Encounter for medical examination to establish care (Primary) Patient is a 38 year old male who is seen today to establish care. Transfers from PCP with [facility]. Various concerns and other complaints are addressed at today's visit.  - Comprehensive Metabolic Panel (CMET) - CBC with Differential/Platelet - Vitamin D (25 hydroxy)  2. Obesity (BMI 30-39.9) BMI of 33. He admits his wife tends to make healthier choices than he does. Due to weight and other concerns documented below, requests screenings including lipid profile and A1c. Labs ordered. - Lipid Profile - Comprehensive Metabolic Panel (CMET) - TSH  3. Asthma, unspecified asthma severity, unspecified whether complicated, unspecified whether persistent Patient confirms asthma diagnosis, however well managed. Last rescue inhaler use was one month ago.   4. Family history of colon cancer Family hx of colon cancer, notably his father. He voices his father was diagnosed when he (the patient) was in high school, unsure of exact age his father was diagnosed. Patient reports he has been undergoing routine colonoscopies every 5 years starting at the age of 74, unless polyps or other abnormalities are discovered and then is recalled for one year follow up/ colonoscopy.  -Reports last colonoscopy was in 2020.  -Reports he is working on getting this years colonoscopy scheduled as he is aware he is due.   5. Lipid screening - Lipid Profile  6. Penile rash Erythematous macule present on the left side of the penile head. No drainage or tenderness  present on exam. As per HPI he noticed what he describes to be a rash approximately one month ago after getting out the shower. He voices approximately 3 days ago the skin did peel and returned to presentation seen today on exam. He does endorse he sweats a lot. We discussed that I feel that moisture could play a role in rash as described. Recommended OTC Lotrimin to see if this resolves rash. He does request STD testing which will be completed today including HSV.  - RPR - HSV(herpes simplex vrs) 1+2 ab-IgG  7. Screening for STD (sexually transmitted disease) Requests STD screening prompted by penile rash. Denies known exposure. He is married and sexually active with one partner, his wife.  - HIV antibody (with reflex) - RPR - Urine cytology ancillary only  8. Dizziness One episode of dizziness yesterday while working. He had been staring at the computer screen trying to read what he describes as small text. Dizziness lasted approximately one hour, no more than two hours. Denies recurrence. Denies LOC. Denies feeling faint or near syncope.  -Recommended updated eye exam and potentially blue light glasses.  - Comprehensive Metabolic Panel (CMET) - CBC with Differential/Platelet - HgB A1c - TSH  9. Family history of diabetes mellitus He presents to establish care and brings a list that he and his wife discussed  regarding desired testing. No hx of diabetes or prediabetes but does report family hx of diabetes. Will obtain A1c for screening.  - HgB A1c  10. Decreased libido He reports decrease in libido compared to prior years. Inquires about checking testosterone level. Had also requested vitamin D level to be checked, and there are studies that show a correlation in low vitamin D and decreased libido. Labs obtained.  - Vitamin D (25 hydroxy) - Testosterone   11. Neck pain He complains of neck pain starting several months ago. He reports he has been trying to do gentle stretching to help his  neck pain, otherwise denies other attempted alleviating factors. Discussed starting Celebrex 100mg  BID PRN pain and Tizanidine 4mg  at bedtime PRN pain. If no improvement after a couple of weeks could consider ortho referral. He is receptive. Return precautions advised.   - celecoxib (CELEBREX) 100 MG capsule; Take 1 capsule (100 mg total) by mouth 2 (two) times daily as needed (neck pain).  Dispense: 20 capsule; Refill: 0 - tiZANidine (ZANAFLEX) 4 MG tablet; Take 1 tablet (4 mg total) by mouth at bedtime as needed (neck pain).  Dispense: 10 tablet; Refill: 0    Return in about 6 months (around 06/09/2025) for annual physical.   LAYMON LOISE CORE, FNP

## 2024-12-10 LAB — SYPHILIS: RPR W/REFLEX TO RPR TITER AND TREPONEMAL ANTIBODIES, TRADITIONAL SCREENING AND DIAGNOSIS ALGORITHM
RPR Ser Ql: NONREACTIVE
RPR Ser Ql: NONREACTIVE

## 2024-12-10 LAB — CBC WITH DIFFERENTIAL/PLATELET
Absolute Lymphocytes: 1622 {cells}/uL (ref 850–3900)
Absolute Monocytes: 311 {cells}/uL (ref 200–950)
Basophils Absolute: 31 {cells}/uL (ref 0–200)
Basophils Relative: 0.6 %
Eosinophils Absolute: 41 {cells}/uL (ref 15–500)
Eosinophils Relative: 0.8 %
HCT: 44.8 % (ref 39.4–51.1)
Hemoglobin: 14.4 g/dL (ref 13.2–17.1)
MCH: 28.9 pg (ref 27.0–33.0)
MCHC: 32.1 g/dL (ref 31.6–35.4)
MCV: 90 fL (ref 81.4–101.7)
MPV: 9.8 fL (ref 7.5–12.5)
Monocytes Relative: 6.1 %
Neutro Abs: 3096 {cells}/uL (ref 1500–7800)
Neutrophils Relative %: 60.7 %
Platelets: 237 Thousand/uL (ref 140–400)
RBC: 4.98 Million/uL (ref 4.20–5.80)
RDW: 12 % (ref 11.0–15.0)
Total Lymphocyte: 31.8 %
WBC: 5.1 Thousand/uL (ref 3.8–10.8)

## 2024-12-10 LAB — LIPID PANEL
Cholesterol: 199 mg/dL (ref <200)
HDL: 42 mg/dL (ref >=40)
LDL Cholesterol (Calc): 130 mg/dL — ABNORMAL HIGH
Non-HDL Cholesterol (Calc): 157 mg/dL — ABNORMAL HIGH (ref <130)
Total CHOL/HDL Ratio: 4.7 (calc) (ref <5.0)
Triglycerides: 157 mg/dL — ABNORMAL HIGH (ref <150)

## 2024-12-10 LAB — COMPREHENSIVE METABOLIC PANEL WITH GFR
AG Ratio: 1.5 (calc) (ref 1.0–2.5)
ALT: 29 U/L (ref 9–46)
AST: 20 U/L (ref 10–40)
Albumin: 4.5 g/dL (ref 3.6–5.1)
Alkaline phosphatase (APISO): 83 U/L (ref 36–130)
BUN: 10 mg/dL (ref 7–25)
CO2: 32 mmol/L (ref 20–32)
Calcium: 10.1 mg/dL (ref 8.6–10.3)
Chloride: 100 mmol/L (ref 98–110)
Creat: 0.96 mg/dL (ref 0.60–1.26)
Globulin: 3.1 g/dL (ref 1.9–3.7)
Glucose, Bld: 84 mg/dL (ref 65–99)
Potassium: 4.2 mmol/L (ref 3.5–5.3)
Sodium: 138 mmol/L (ref 135–146)
Total Bilirubin: 1 mg/dL (ref 0.2–1.2)
Total Protein: 7.6 g/dL (ref 6.1–8.1)
eGFR: 103 mL/min/1.73m2 (ref >=60)

## 2024-12-10 LAB — VITAMIN D 25 HYDROXY (VIT D DEFICIENCY, FRACTURES): Vit D, 25-Hydroxy: 38 ng/mL (ref 30–100)

## 2024-12-10 LAB — HIV ANTIBODY (ROUTINE TESTING W REFLEX)
HIV 1&2 Ab, 4th Generation: NONREACTIVE
HIV FINAL INTERPRETATION: NEGATIVE

## 2024-12-10 LAB — TSH: TSH: 2.11 m[IU]/L (ref 0.40–4.50)

## 2024-12-10 LAB — TESTOSTERONE: Testosterone: 503 ng/dL (ref 250–827)

## 2024-12-10 LAB — HEMOGLOBIN A1C
Hgb A1c MFr Bld: 4.8 % (ref <5.7)
Mean Plasma Glucose: 91 mg/dL
eAG (mmol/L): 5 mmol/L

## 2024-12-10 LAB — HSV(HERPES SIMPLEX VRS) I + II AB-IGG
HSV 1 Glycoprotein G Ab, IgG: NONREACTIVE
HSV 2 Glycoprotein G Ab, IgG: NONREACTIVE

## 2024-12-12 ENCOUNTER — Ambulatory Visit: Payer: Self-pay | Admitting: Family Medicine

## 2024-12-12 LAB — URINE CYTOLOGY ANCILLARY ONLY
Chlamydia: NEGATIVE
Comment: NEGATIVE
Comment: NORMAL
Neisseria Gonorrhea: NEGATIVE

## 2025-06-12 ENCOUNTER — Encounter: Admitting: Family Medicine
